# Patient Record
Sex: Female | Born: 1990 | Race: White | Hispanic: No | Marital: Single | Smoking: Current every day smoker
Health system: Southern US, Community
[De-identification: ages and names within clinical notes are randomized; demographics above are authoritative.]

## PROBLEM LIST (undated history)

## (undated) DIAGNOSIS — S3282XA Multiple fractures of pelvis without disruption of pelvic ring, initial encounter for closed fracture: Secondary | ICD-10-CM

## (undated) DIAGNOSIS — S92902A Unspecified fracture of left foot, initial encounter for closed fracture: Secondary | ICD-10-CM

## (undated) DIAGNOSIS — R569 Unspecified convulsions: Secondary | ICD-10-CM

## (undated) DIAGNOSIS — F419 Anxiety disorder, unspecified: Secondary | ICD-10-CM

## (undated) HISTORY — PX: FRACTURE SURGERY: SHX138

## (undated) HISTORY — PX: DILATION AND CURETTAGE OF UTERUS: SHX78

---

## 2017-12-21 ENCOUNTER — Emergency Department (HOSPITAL_COMMUNITY): Payer: No Typology Code available for payment source

## 2017-12-21 ENCOUNTER — Inpatient Hospital Stay (HOSPITAL_COMMUNITY)
Admission: EM | Admit: 2017-12-21 | Discharge: 2017-12-27 | DRG: 956 | Disposition: A | Discharge status: Rehabilitation Facility | Payer: No Typology Code available for payment source | Attending: Surgery | Admitting: Surgery

## 2017-12-21 ENCOUNTER — Encounter (HOSPITAL_COMMUNITY): Payer: Self-pay | Admitting: Pharmacy Technician

## 2017-12-21 DIAGNOSIS — S7292XA Unspecified fracture of left femur, initial encounter for closed fracture: Secondary | ICD-10-CM

## 2017-12-21 DIAGNOSIS — Y92410 Unspecified street and highway as the place of occurrence of the external cause: Secondary | ICD-10-CM

## 2017-12-21 DIAGNOSIS — S060X0A Concussion without loss of consciousness, initial encounter: Secondary | ICD-10-CM | POA: Diagnosis present

## 2017-12-21 DIAGNOSIS — S92242A Displaced fracture of medial cuneiform of left foot, initial encounter for closed fracture: Secondary | ICD-10-CM | POA: Diagnosis present

## 2017-12-21 DIAGNOSIS — S91311A Laceration without foreign body, right foot, initial encounter: Secondary | ICD-10-CM | POA: Diagnosis present

## 2017-12-21 DIAGNOSIS — S3993XA Unspecified injury of pelvis, initial encounter: Secondary | ICD-10-CM

## 2017-12-21 DIAGNOSIS — M25552 Pain in left hip: Secondary | ICD-10-CM | POA: Diagnosis present

## 2017-12-21 DIAGNOSIS — S92902A Unspecified fracture of left foot, initial encounter for closed fracture: Secondary | ICD-10-CM

## 2017-12-21 DIAGNOSIS — S72452A Displaced supracondylar fracture without intracondylar extension of lower end of left femur, initial encounter for closed fracture: Principal | ICD-10-CM | POA: Diagnosis present

## 2017-12-21 DIAGNOSIS — S82839A Other fracture of upper and lower end of unspecified fibula, initial encounter for closed fracture: Secondary | ICD-10-CM | POA: Diagnosis present

## 2017-12-21 DIAGNOSIS — S71122A Laceration with foreign body, left thigh, initial encounter: Secondary | ICD-10-CM | POA: Diagnosis present

## 2017-12-21 DIAGNOSIS — F419 Anxiety disorder, unspecified: Secondary | ICD-10-CM | POA: Diagnosis present

## 2017-12-21 DIAGNOSIS — S0011XA Contusion of right eyelid and periocular area, initial encounter: Secondary | ICD-10-CM | POA: Diagnosis present

## 2017-12-21 DIAGNOSIS — G40909 Epilepsy, unspecified, not intractable, without status epilepticus: Secondary | ICD-10-CM | POA: Diagnosis present

## 2017-12-21 DIAGNOSIS — R7401 Elevation of levels of liver transaminase levels: Secondary | ICD-10-CM

## 2017-12-21 DIAGNOSIS — S3210XA Unspecified fracture of sacrum, initial encounter for closed fracture: Secondary | ICD-10-CM | POA: Diagnosis present

## 2017-12-21 DIAGNOSIS — Z419 Encounter for procedure for purposes other than remedying health state, unspecified: Secondary | ICD-10-CM

## 2017-12-21 DIAGNOSIS — S32810A Multiple fractures of pelvis with stable disruption of pelvic ring, initial encounter for closed fracture: Secondary | ICD-10-CM | POA: Diagnosis present

## 2017-12-21 DIAGNOSIS — K5900 Constipation, unspecified: Secondary | ICD-10-CM | POA: Diagnosis present

## 2017-12-21 DIAGNOSIS — S72409A Unspecified fracture of lower end of unspecified femur, initial encounter for closed fracture: Secondary | ICD-10-CM

## 2017-12-21 DIAGNOSIS — S36114A Minor laceration of liver, initial encounter: Secondary | ICD-10-CM | POA: Diagnosis present

## 2017-12-21 DIAGNOSIS — T148XXA Other injury of unspecified body region, initial encounter: Secondary | ICD-10-CM

## 2017-12-21 DIAGNOSIS — S72402A Unspecified fracture of lower end of left femur, initial encounter for closed fracture: Secondary | ICD-10-CM | POA: Diagnosis present

## 2017-12-21 DIAGNOSIS — R945 Abnormal results of liver function studies: Secondary | ICD-10-CM | POA: Diagnosis present

## 2017-12-21 DIAGNOSIS — S92413A Displaced fracture of proximal phalanx of unspecified great toe, initial encounter for closed fracture: Secondary | ICD-10-CM | POA: Diagnosis present

## 2017-12-21 DIAGNOSIS — R339 Retention of urine, unspecified: Secondary | ICD-10-CM | POA: Diagnosis not present

## 2017-12-21 DIAGNOSIS — F17210 Nicotine dependence, cigarettes, uncomplicated: Secondary | ICD-10-CM | POA: Diagnosis present

## 2017-12-21 DIAGNOSIS — D62 Acute posthemorrhagic anemia: Secondary | ICD-10-CM | POA: Diagnosis not present

## 2017-12-21 DIAGNOSIS — Z23 Encounter for immunization: Secondary | ICD-10-CM | POA: Diagnosis not present

## 2017-12-21 DIAGNOSIS — S71112A Laceration without foreign body, left thigh, initial encounter: Secondary | ICD-10-CM | POA: Diagnosis present

## 2017-12-21 DIAGNOSIS — S72352A Displaced comminuted fracture of shaft of left femur, initial encounter for closed fracture: Secondary | ICD-10-CM

## 2017-12-21 DIAGNOSIS — R740 Nonspecific elevation of levels of transaminase and lactic acid dehydrogenase [LDH]: Secondary | ICD-10-CM

## 2017-12-21 DIAGNOSIS — S3282XA Multiple fractures of pelvis without disruption of pelvic ring, initial encounter for closed fracture: Secondary | ICD-10-CM

## 2017-12-21 DIAGNOSIS — R609 Edema, unspecified: Secondary | ICD-10-CM

## 2017-12-21 HISTORY — DX: Multiple fractures of pelvis without disruption of pelvic ring, initial encounter for closed fracture: S32.82XA

## 2017-12-21 HISTORY — DX: Unspecified convulsions: R56.9

## 2017-12-21 HISTORY — DX: Unspecified fracture of left foot, initial encounter for closed fracture: S92.902A

## 2017-12-21 HISTORY — DX: Anxiety disorder, unspecified: F41.9

## 2017-12-21 LAB — COMPREHENSIVE METABOLIC PANEL
ALK PHOS: 73 U/L (ref 38–126)
ALT: 325 U/L — AB (ref 14–54)
AST: 658 U/L — AB (ref 15–41)
Albumin: 3.4 g/dL — ABNORMAL LOW (ref 3.5–5.0)
Anion gap: 8 (ref 5–15)
BILIRUBIN TOTAL: 0.7 mg/dL (ref 0.3–1.2)
BUN: 11 mg/dL (ref 6–20)
CO2: 21 mmol/L — ABNORMAL LOW (ref 22–32)
CREATININE: 0.78 mg/dL (ref 0.44–1.00)
Calcium: 8 mg/dL — ABNORMAL LOW (ref 8.9–10.3)
Chloride: 107 mmol/L (ref 101–111)
Glucose, Bld: 256 mg/dL — ABNORMAL HIGH (ref 65–99)
Potassium: 4 mmol/L (ref 3.5–5.1)
Sodium: 136 mmol/L (ref 135–145)
TOTAL PROTEIN: 5.5 g/dL — AB (ref 6.5–8.1)

## 2017-12-21 LAB — CBC
HCT: 32.8 % — ABNORMAL LOW (ref 36.0–46.0)
Hemoglobin: 11.1 g/dL — ABNORMAL LOW (ref 12.0–15.0)
MCH: 31.5 pg (ref 26.0–34.0)
MCHC: 33.8 g/dL (ref 30.0–36.0)
MCV: 93.2 fL (ref 78.0–100.0)
PLATELETS: 203 10*3/uL (ref 150–400)
RBC: 3.52 MIL/uL — ABNORMAL LOW (ref 3.87–5.11)
RDW: 13.9 % (ref 11.5–15.5)
WBC: 18.6 10*3/uL — AB (ref 4.0–10.5)

## 2017-12-21 LAB — I-STAT CHEM 8, ED
BUN: 11 mg/dL (ref 6–20)
CREATININE: 0.7 mg/dL (ref 0.44–1.00)
Calcium, Ion: 1.11 mmol/L — ABNORMAL LOW (ref 1.15–1.40)
Chloride: 104 mmol/L (ref 101–111)
GLUCOSE: 252 mg/dL — AB (ref 65–99)
HEMATOCRIT: 31 % — AB (ref 36.0–46.0)
Hemoglobin: 10.5 g/dL — ABNORMAL LOW (ref 12.0–15.0)
POTASSIUM: 4 mmol/L (ref 3.5–5.1)
Sodium: 140 mmol/L (ref 135–145)
TCO2: 22 mmol/L (ref 22–32)

## 2017-12-21 LAB — SAMPLE TO BLOOD BANK

## 2017-12-21 LAB — CDS SEROLOGY

## 2017-12-21 LAB — I-STAT CG4 LACTIC ACID, ED: LACTIC ACID, VENOUS: 1.33 mmol/L (ref 0.5–1.9)

## 2017-12-21 LAB — PROTIME-INR
INR: 1.11
PROTHROMBIN TIME: 14.2 s (ref 11.4–15.2)

## 2017-12-21 LAB — ETHANOL

## 2017-12-21 LAB — I-STAT BETA HCG BLOOD, ED (MC, WL, AP ONLY)

## 2017-12-21 MED ORDER — HYDROMORPHONE HCL 1 MG/ML IJ SOLN
1.0000 mg | INTRAMUSCULAR | Status: DC | PRN
  Administered 2017-12-21: 1 mg via INTRAVENOUS
  Filled 2017-12-21 (×3): qty 1

## 2017-12-21 MED ORDER — IOPAMIDOL (ISOVUE-300) INJECTION 61%
INTRAVENOUS | Status: AC
  Administered 2017-12-21: 100 mL
  Filled 2017-12-21: qty 100

## 2017-12-21 MED ORDER — DIAZEPAM 5 MG/ML IJ SOLN
INTRAMUSCULAR | Status: AC
  Filled 2017-12-21: qty 2

## 2017-12-21 MED ORDER — CEFAZOLIN SODIUM-DEXTROSE 1-4 GM/50ML-% IV SOLN
1.0000 g | Freq: Once | INTRAVENOUS | Status: AC
  Administered 2017-12-21: 1 g via INTRAVENOUS
  Filled 2017-12-21: qty 50

## 2017-12-21 MED ORDER — HYDROMORPHONE HCL 1 MG/ML IJ SOLN
1.0000 mg | Freq: Once | INTRAMUSCULAR | Status: AC
  Administered 2017-12-21: 1 mg via INTRAVENOUS
  Filled 2017-12-21: qty 1

## 2017-12-21 MED ORDER — ONDANSETRON HCL 4 MG/2ML IJ SOLN
4.0000 mg | Freq: Once | INTRAMUSCULAR | Status: AC
  Administered 2017-12-21: 4 mg via INTRAVENOUS
  Filled 2017-12-21: qty 2

## 2017-12-21 MED ORDER — FENTANYL CITRATE (PF) 100 MCG/2ML IJ SOLN
100.0000 ug | Freq: Once | INTRAMUSCULAR | Status: AC
Start: 2017-12-21 — End: 2017-12-21
  Administered 2017-12-21: 100 ug via INTRAVENOUS
  Filled 2017-12-21: qty 2

## 2017-12-21 MED ORDER — TETANUS-DIPHTH-ACELL PERTUSSIS 5-2.5-18.5 LF-MCG/0.5 IM SUSP
0.5000 mL | Freq: Once | INTRAMUSCULAR | Status: AC
  Administered 2017-12-21: 0.5 mL via INTRAMUSCULAR
  Filled 2017-12-21: qty 0.5
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
## 2017-12-21 NOTE — ED Notes (Signed)
[facility] Md  at bedside.
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
## 2017-12-21 NOTE — Progress Notes (Signed)
Orthopedic Tech Progress Note Patient Details:  Michele DoloresDarian Robertson 12/06/1991 [id number]  Ortho Devices Type of Ortho Device: Knee Immobilizer Ortho Device/Splint Location: lle Ortho Device/Splint Interventions: Ordered, Application, Adjustment   Post Interventions Patient Tolerated: Well Instructions Provided: Care of device, Adjustment of device   Trinna PostMartinez, Karlin Heilman J 12/21/2017, 10:56 PM

## 2017-12-21 NOTE — ED Notes (Signed)
Mother Elease Hashimotoatricia [phone] Boyfriend Denzel, [phone]

## 2017-12-22 ENCOUNTER — Encounter (HOSPITAL_COMMUNITY): Payer: Self-pay | Admitting: Anesthesiology

## 2017-12-22 ENCOUNTER — Encounter (HOSPITAL_COMMUNITY): Admission: EM | Disposition: A | Discharge status: Rehabilitation Facility | Payer: Self-pay | Source: Home / Self Care

## 2017-12-22 ENCOUNTER — Inpatient Hospital Stay (HOSPITAL_COMMUNITY): Payer: No Typology Code available for payment source | Admitting: Anesthesiology

## 2017-12-22 ENCOUNTER — Inpatient Hospital Stay (HOSPITAL_COMMUNITY): Payer: No Typology Code available for payment source

## 2017-12-22 DIAGNOSIS — S71122A Laceration with foreign body, left thigh, initial encounter: Secondary | ICD-10-CM | POA: Diagnosis present

## 2017-12-22 HISTORY — PX: EXTERNAL FIXATION LEG: SHX1549

## 2017-12-22 LAB — COMPREHENSIVE METABOLIC PANEL
ALK PHOS: 63 U/L (ref 38–126)
ALT: 279 U/L — AB (ref 14–54)
AST: 414 U/L — AB (ref 15–41)
Albumin: 3.4 g/dL — ABNORMAL LOW (ref 3.5–5.0)
Anion gap: 8 (ref 5–15)
BILIRUBIN TOTAL: 0.7 mg/dL (ref 0.3–1.2)
BUN: 10 mg/dL (ref 6–20)
CALCIUM: 8.1 mg/dL — AB (ref 8.9–10.3)
CO2: 23 mmol/L (ref 22–32)
Chloride: 107 mmol/L (ref 101–111)
Creatinine, Ser: 0.67 mg/dL (ref 0.44–1.00)
GFR calc Af Amer: >60 mL/min (ref >60)
GFR calc non Af Amer: >60 mL/min (ref >60)
GLUCOSE: 159 mg/dL — AB (ref 65–99)
Potassium: 4.1 mmol/L (ref 3.5–5.1)
SODIUM: 138 mmol/L (ref 135–145)
TOTAL PROTEIN: 5.4 g/dL — AB (ref 6.5–8.1)

## 2017-12-22 LAB — CBC
HCT: 26.9 % — ABNORMAL LOW (ref 36.0–46.0)
HCT: 25 % — ABNORMAL LOW (ref 36.0–46.0)
HEMATOCRIT: 29.4 % — AB (ref 36.0–46.0)
HEMOGLOBIN: 10 g/dL — AB (ref 12.0–15.0)
Hemoglobin: 9.2 g/dL — ABNORMAL LOW (ref 12.0–15.0)
Hemoglobin: 8.5 g/dL — ABNORMAL LOW (ref 12.0–15.0)
MCH: 31.9 pg (ref 26.0–34.0)
MCH: 31.8 pg (ref 26.0–34.0)
MCH: 32.1 pg (ref 26.0–34.0)
MCHC: 34 g/dL (ref 30.0–36.0)
MCHC: 34.2 g/dL (ref 30.0–36.0)
MCHC: 34 g/dL (ref 30.0–36.0)
MCV: 93.9 fL (ref 78.0–100.0)
MCV: 93.1 fL (ref 78.0–100.0)
MCV: 94.3 fL (ref 78.0–100.0)
PLATELETS: 148 10*3/uL — AB (ref 150–400)
PLATELETS: 121 10*3/uL — AB (ref 150–400)
Platelets: 156 10*3/uL (ref 150–400)
RBC: 3.13 MIL/uL — AB (ref 3.87–5.11)
RBC: 2.89 MIL/uL — ABNORMAL LOW (ref 3.87–5.11)
RBC: 2.65 MIL/uL — ABNORMAL LOW (ref 3.87–5.11)
RDW: 14.4 % (ref 11.5–15.5)
RDW: 14 % (ref 11.5–15.5)
RDW: 14.4 % (ref 11.5–15.5)
WBC: 12.8 10*3/uL — AB (ref 4.0–10.5)
WBC: 12.3 10*3/uL — ABNORMAL HIGH (ref 4.0–10.5)
WBC: 10.7 10*3/uL — ABNORMAL HIGH (ref 4.0–10.5)

## 2017-12-22 LAB — URINALYSIS, ROUTINE W REFLEX MICROSCOPIC
Bacteria, UA: NONE SEEN
Bilirubin Urine: NEGATIVE
GLUCOSE, UA: 50 mg/dL — AB
Hgb urine dipstick: NEGATIVE
KETONES UR: NEGATIVE mg/dL
Nitrite: NEGATIVE
PH: 5 (ref 5.0–8.0)
PROTEIN: NEGATIVE mg/dL
Specific Gravity, Urine: >1.046 — ABNORMAL HIGH (ref 1.005–1.030)

## 2017-12-22 LAB — HIV ANTIBODY (ROUTINE TESTING W REFLEX): HIV SCREEN 4TH GENERATION: NONREACTIVE

## 2017-12-22 LAB — MRSA PCR SCREENING: MRSA by PCR: POSITIVE — AB

## 2017-12-22 SURGERY — EXTERNAL FIXATION, LOWER EXTREMITY
Anesthesia: General | Site: Leg Upper | Laterality: Left

## 2017-12-22 MED ORDER — OXYCODONE HCL 5 MG/5ML PO SOLN: 5.0000 mg | Freq: Once | ORAL | Status: DC | PRN

## 2017-12-22 MED ORDER — ARTIFICIAL TEARS OPHTHALMIC OINT
TOPICAL_OINTMENT | OPHTHALMIC | Status: AC
  Filled 2017-12-22: qty 3.5

## 2017-12-22 MED ORDER — HYDROMORPHONE HCL 1 MG/ML IJ SOLN
0.2500 mg | INTRAMUSCULAR | Status: DC | PRN
  Administered 2017-12-22 (×2): 0.5 mg via INTRAVENOUS

## 2017-12-22 MED ORDER — ONDANSETRON HCL 4 MG/2ML IJ SOLN
INTRAMUSCULAR | Status: DC | PRN
  Administered 2017-12-22: 4 mg via INTRAVENOUS

## 2017-12-22 MED ORDER — PROPOFOL 10 MG/ML IV BOLUS
INTRAVENOUS | Status: AC
  Filled 2017-12-22: qty 40

## 2017-12-22 MED ORDER — ACETAMINOPHEN 325 MG PO TABS
650.0000 mg | ORAL_TABLET | Freq: Four times a day (QID) | ORAL | Status: DC | PRN
  Administered 2017-12-22 – 2017-12-23 (×3): 650 mg via ORAL
  Filled 2017-12-22 (×3): qty 2

## 2017-12-22 MED ORDER — CEFAZOLIN SODIUM-DEXTROSE 2-3 GM-%(50ML) IV SOLR
INTRAVENOUS | Status: DC | PRN
  Administered 2017-12-22: 2 g via INTRAVENOUS

## 2017-12-22 MED ORDER — HYDROMORPHONE HCL 1 MG/ML IJ SOLN
INTRAMUSCULAR | Status: AC
  Administered 2017-12-22: 0.5 mg via INTRAVENOUS
  Filled 2017-12-22: qty 1

## 2017-12-22 MED ORDER — MIDAZOLAM HCL 2 MG/2ML IJ SOLN
INTRAMUSCULAR | Status: AC
  Filled 2017-12-22: qty 2

## 2017-12-22 MED ORDER — PROPOFOL 10 MG/ML IV BOLUS
INTRAVENOUS | Status: DC | PRN
  Administered 2017-12-22: 110 mg via INTRAVENOUS

## 2017-12-22 MED ORDER — HYDROMORPHONE HCL 1 MG/ML IJ SOLN: 0.5000 mg | INTRAMUSCULAR | Status: DC | PRN

## 2017-12-22 MED ORDER — LACTATED RINGERS IV SOLN
INTRAVENOUS | Status: DC | PRN
  Administered 2017-12-22: via INTRAVENOUS

## 2017-12-22 MED ORDER — LIDOCAINE HCL (CARDIAC) 20 MG/ML IV SOLN
INTRAVENOUS | Status: DC | PRN
  Administered 2017-12-22: 60 mg via INTRAVENOUS

## 2017-12-22 MED ORDER — HYDROMORPHONE HCL 1 MG/ML IJ SOLN
1.0000 mg | INTRAMUSCULAR | Status: DC | PRN
  Administered 2017-12-22 – 2017-12-23 (×10): 1 mg via INTRAVENOUS
  Filled 2017-12-22 (×10): qty 1

## 2017-12-22 MED ORDER — OXYCODONE HCL 5 MG PO TABS
5.0000 mg | ORAL_TABLET | ORAL | Status: DC | PRN
  Administered 2017-12-22 – 2017-12-23 (×4): 5 mg via ORAL
  Filled 2017-12-22 (×4): qty 1

## 2017-12-22 MED ORDER — SODIUM CHLORIDE 0.9 % IV SOLN
INTRAVENOUS | Status: DC
  Administered 2017-12-22: 04:00:00 via INTRAVENOUS

## 2017-12-22 MED ORDER — DEXAMETHASONE SODIUM PHOSPHATE 10 MG/ML IJ SOLN
INTRAMUSCULAR | Status: DC | PRN
  Administered 2017-12-22: 4 mg via INTRAVENOUS

## 2017-12-22 MED ORDER — FENTANYL CITRATE (PF) 100 MCG/2ML IJ SOLN: 25.0000 ug | INTRAMUSCULAR | Status: DC | PRN

## 2017-12-22 MED ORDER — SODIUM CHLORIDE 0.9 % IR SOLN
Status: DC | PRN
  Administered 2017-12-22: 1

## 2017-12-22 MED ORDER — FENTANYL CITRATE (PF) 250 MCG/5ML IJ SOLN
INTRAMUSCULAR | Status: AC
  Filled 2017-12-22: qty 5

## 2017-12-22 MED ORDER — LIDOCAINE 2% (20 MG/ML) 5 ML SYRINGE
INTRAMUSCULAR | Status: AC
  Filled 2017-12-22: qty 5

## 2017-12-22 MED ORDER — SUCCINYLCHOLINE CHLORIDE 200 MG/10ML IV SOSY
PREFILLED_SYRINGE | INTRAVENOUS | Status: AC
  Filled 2017-12-22: qty 10

## 2017-12-22 MED ORDER — OXYCODONE HCL 5 MG PO TABS: 5.0000 mg | ORAL_TABLET | Freq: Once | ORAL | Status: DC | PRN

## 2017-12-22 MED ORDER — CEFAZOLIN SODIUM-DEXTROSE 2-4 GM/100ML-% IV SOLN
INTRAVENOUS | Status: AC
  Filled 2017-12-22: qty 100

## 2017-12-22 MED ORDER — FENTANYL CITRATE (PF) 100 MCG/2ML IJ SOLN
INTRAMUSCULAR | Status: DC | PRN
  Administered 2017-12-22: 100 ug via INTRAVENOUS

## 2017-12-22 MED ORDER — CEFAZOLIN SODIUM-DEXTROSE 2-4 GM/100ML-% IV SOLN
2.0000 g | Freq: Four times a day (QID) | INTRAVENOUS | Status: AC
  Administered 2017-12-22 (×2): 2 g via INTRAVENOUS
  Filled 2017-12-22 (×3): qty 100

## 2017-12-22 MED ORDER — SUCCINYLCHOLINE CHLORIDE 20 MG/ML IJ SOLN
INTRAMUSCULAR | Status: DC | PRN
  Administered 2017-12-22: 50 mg via INTRAVENOUS

## 2017-12-22 MED ORDER — ONDANSETRON HCL 4 MG/2ML IJ SOLN
4.0000 mg | Freq: Four times a day (QID) | INTRAMUSCULAR | Status: DC | PRN
  Administered 2017-12-22: 4 mg via INTRAVENOUS
  Filled 2017-12-22: qty 2

## 2017-12-22 MED ORDER — KCL IN DEXTROSE-NACL 20-5-0.45 MEQ/L-%-% IV SOLN
INTRAVENOUS | Status: DC
  Administered 2017-12-22: 13:00:00 via INTRAVENOUS
  Administered 2017-12-22: 50 mL/h via INTRAVENOUS
  Administered 2017-12-23: 23:00:00 via INTRAVENOUS
  Filled 2017-12-22 (×2): qty 1000

## 2017-12-22 MED ORDER — ONDANSETRON HCL 4 MG/2ML IJ SOLN
INTRAMUSCULAR | Status: AC
  Filled 2017-12-22: qty 2

## 2017-12-22 MED ORDER — ONDANSETRON 4 MG PO TBDP: 4.0000 mg | ORAL_TABLET | Freq: Four times a day (QID) | ORAL | Status: DC | PRN

## 2017-12-22 MED ORDER — DEXAMETHASONE SODIUM PHOSPHATE 10 MG/ML IJ SOLN
INTRAMUSCULAR | Status: AC
  Filled 2017-12-22: qty 1

## 2017-12-22 MED ORDER — ENOXAPARIN SODIUM 40 MG/0.4ML ~~LOC~~ SOLN
40.0000 mg | SUBCUTANEOUS | Status: DC
  Administered 2017-12-22 – 2017-12-26 (×5): 40 mg via SUBCUTANEOUS
  Filled 2017-12-22 (×5): qty 0.4

## 2017-12-22 SURGICAL SUPPLY — 52 items
BANDAGE ACE 4X5 VEL STRL LF (GAUZE/BANDAGES/DRESSINGS) ×3 IMPLANT
BANDAGE ACE 6X5 VEL STRL LF (GAUZE/BANDAGES/DRESSINGS) ×3 IMPLANT
BANDAGE ELASTIC 4 VELCRO ST LF (GAUZE/BANDAGES/DRESSINGS) ×3 IMPLANT
BANDAGE ELASTIC 6 VELCRO ST LF (GAUZE/BANDAGES/DRESSINGS) ×3 IMPLANT
BNDG COHESIVE 6X5 TAN STRL LF (GAUZE/BANDAGES/DRESSINGS) ×3 IMPLANT
BNDG GAUZE ELAST 4 BULKY (GAUZE/BANDAGES/DRESSINGS) ×3 IMPLANT
CANISTER WOUNDNEG PRESSURE 500 (CANNISTER) ×3 IMPLANT
CLAMP LG COMBINATION (Clamp) ×3 IMPLANT
CLAMP LG MULTI PIN (Clamp) ×6 IMPLANT
COVER SURGICAL LIGHT HANDLE (MISCELLANEOUS) ×3 IMPLANT
DRAPE C-ARM 42X72 X-RAY (DRAPES) IMPLANT
DRAPE C-ARMOR (DRAPES) ×3 IMPLANT
DRAPE ORTHO SPLIT 87X125 STRL (DRAPES) ×6 IMPLANT
DRAPE U-SHAPE 47X51 STRL (DRAPES) ×3 IMPLANT
DRSG ADAPTIC 3X8 NADH LF (GAUZE/BANDAGES/DRESSINGS) ×3 IMPLANT
DRSG VAC ATS SM SENSATRAC (GAUZE/BANDAGES/DRESSINGS) ×3 IMPLANT
ELECT REM PT RETURN 9FT ADLT (ELECTROSURGICAL) ×3
ELECTRODE REM PT RTRN 9FT ADLT (ELECTROSURGICAL) ×1 IMPLANT
GAUZE SPONGE 4X4 12PLY STRL (GAUZE/BANDAGES/DRESSINGS) ×3 IMPLANT
GAUZE XEROFORM 5X9 LF (GAUZE/BANDAGES/DRESSINGS) ×3 IMPLANT
GLOVE BIO SURGEON STRL SZ7.5 (GLOVE) ×3 IMPLANT
GLOVE BIOGEL PI IND STRL 8 (GLOVE) ×2 IMPLANT
GLOVE BIOGEL PI INDICATOR 8 (GLOVE) ×4
GOWN STRL REUS W/ TWL LRG LVL3 (GOWN DISPOSABLE) ×2 IMPLANT
GOWN STRL REUS W/ TWL XL LVL3 (GOWN DISPOSABLE) ×1 IMPLANT
GOWN STRL REUS W/TWL LRG LVL3 (GOWN DISPOSABLE) ×4
GOWN STRL REUS W/TWL XL LVL3 (GOWN DISPOSABLE) ×2
KIT BASIN OR (CUSTOM PROCEDURE TRAY) ×3 IMPLANT
KIT ROOM TURNOVER OR (KITS) ×3 IMPLANT
NEEDLE 22X1 1/2 (OR ONLY) (NEEDLE) IMPLANT
NS IRRIG 1000ML POUR BTL (IV SOLUTION) ×3 IMPLANT
PACK ORTHO EXTREMITY (CUSTOM PROCEDURE TRAY) ×3 IMPLANT
PAD ABD 8X10 STRL (GAUZE/BANDAGES/DRESSINGS) ×3 IMPLANT
PAD ARMBOARD 7.5X6 YLW CONV (MISCELLANEOUS) ×6 IMPLANT
PADDING CAST COTTON 6X4 STRL (CAST SUPPLIES) ×9 IMPLANT
ROD CRBN FBR LRG EX-FX 11X250 (Rod) ×3 IMPLANT
ROD CRBN FBR LRG EX-FX 11X300 (Rod) ×3 IMPLANT
SCREW SCHNZ SD 5.0 60 THRD/150 (Screw) ×2 IMPLANT
SCREW SHANZ 5.0X175MM (Screw) ×6 IMPLANT
SCRW SCHANZ SD 5.0 60 THRD/150 (Screw) ×6 IMPLANT
SPONGE LAP 18X18 X RAY DECT (DISPOSABLE) ×3 IMPLANT
STAPLER VISISTAT 35W (STAPLE) IMPLANT
STOCKINETTE IMPERVIOUS LG (DRAPES) ×3 IMPLANT
SUT ETHILON 2 0 FS 18 (SUTURE) ×3 IMPLANT
SYR CONTROL 10ML LL (SYRINGE) IMPLANT
TOWEL OR 17X24 6PK STRL BLUE (TOWEL DISPOSABLE) ×6 IMPLANT
TOWEL OR 17X26 10 PK STRL BLUE (TOWEL DISPOSABLE) ×6 IMPLANT
TUBE CONNECTING 12'X1/4 (SUCTIONS) ×1
TUBE CONNECTING 12X1/4 (SUCTIONS) ×2 IMPLANT
UNDERPAD 30X30 (UNDERPADS AND DIAPERS) ×3 IMPLANT
WATER STERILE IRR 1000ML POUR (IV SOLUTION) ×6 IMPLANT
YANKAUER SUCT BULB TIP NO VENT (SUCTIONS) ×3 IMPLANT

## 2017-12-22 NOTE — Anesthesia Preprocedure Evaluation (Addendum)
Anesthesia Evaluation  Patient identified by MRN, date of birth, ID bandGeneral Assessment Comment:Drowsy from pain meds. Answers questions. Family at bedside to confirm   Reviewed: Allergy & Precautions, NPO status , Patient's Chart, lab work & pertinent test results  History of Anesthesia Complications Negative for: history of anesthetic complications  Airway Mallampati: I  TM Distance: >3 FB Neck ROM: Full    Dental  (+) Teeth Intact, Dental Advisory Given,    Pulmonary neg pulmonary ROS,    Pulmonary exam normal        Cardiovascular negative cardio ROS   Rhythm:Regular Rate:Tachycardia     Neuro/Psych negative neurological ROS  negative psych ROS   GI/Hepatic negative GI ROS, Grade 1 liver lac   Endo/Other  Elevated BG without h/o diabetes, likely stress response, will follow  Renal/GU negative Renal ROS     Musculoskeletal Pelvic fx, open left distal femur fx   Abdominal   Peds  Hematology  (+) anemia ,   Anesthesia Other Findings Right upper tooth with cap/repair per family  Reproductive/Obstetrics                            Anesthesia Physical Anesthesia Plan  ASA: I and emergent  Anesthesia Plan: General   Post-op Pain Management:    Induction: Intravenous, Rapid sequence and Cricoid pressure planned  PONV Risk Score and Plan: 3 and Ondansetron, Dexamethasone and Treatment may vary due to age or medical condition  Airway Management Planned: Oral ETT  Additional Equipment: None  Intra-op Plan:   Post-operative Plan: Extubation in OR  Informed Consent: I have reviewed the patients History and Physical, chart, labs and discussed the procedure including the risks, benefits and alternatives for the proposed anesthesia with the patient or authorized representative who has indicated his/her understanding and acceptance.   Dental advisory given  Plan Discussed with: Surgeon  and CRNA  Anesthesia Plan Comments:         Anesthesia Quick Evaluation

## 2017-12-22 NOTE — Brief Op Note (Signed)
12/22/2017  2:00 AM  PATIENT:  [name]  27 y.o. female  PRE-OPERATIVE DIAGNOSIS:  LEFT DISTAL FEMUR FX  POST-OPERATIVE DIAGNOSIS:  * No post-op diagnosis entered *  PROCEDURE:  Procedure(s): EXTERNAL FIXATION LEG, LEFT KNEE, WOUND DEBRIDEMENT LEFT THIGH WITH VAC APPLICATION (Left)  SURGEON:  Surgeon(s) and Role:    * Yolonda Kidaogers, Vyctoria Dickman Patrick, MD - Primary  PHYSICIAN ASSISTANT:   ASSISTANTS: none   ANESTHESIA:   general  EBL:  25 mL   BLOOD ADMINISTERED:none  DRAINS: single wound VAC, to 125 mm Hg, continuous   LOCAL MEDICATIONS USED:  NONE  SPECIMEN:  No Specimen  DISPOSITION OF SPECIMEN:  N/A  COUNTS:  YES  TOURNIQUET:  * No tourniquets in log *  DICTATION: .Note written in EPIC  PLAN OF CARE: Admit to inpatient   PATIENT DISPOSITION:  PACU - hemodynamically stable.   Delay start of Pharmacological VTE agent (>24hrs) due to surgical blood loss or risk of bleeding: not applicable

## 2017-12-22 NOTE — Anesthesia Postprocedure Evaluation (Signed)
Anesthesia Post Note  Patient: Konrad DoloresDarian Colt  Procedure(s) Performed: EXTERNAL FIXATION LEG, LEFT KNEE, WOUND DEBRIDEMENT LEFT THIGH WITH VAC APPLICATION (Left Leg Upper)     Patient location during evaluation: PACU Anesthesia Type: General Level of consciousness: awake and alert Pain management: pain level controlled Vital Signs Assessment: post-procedure vital signs reviewed and stable Respiratory status: spontaneous breathing, nonlabored ventilation, respiratory function stable and patient connected to nasal cannula oxygen Cardiovascular status: blood pressure returned to baseline and stable Postop Assessment: no apparent nausea or vomiting Anesthetic complications: no    Last Vitals:  Vitals:   12/22/17 0311 12/22/17 0323  BP: (!) 131/95 131/87  Pulse: 90 90  Resp:  13  Temp:  36.8 C  SpO2: 100% 100%    Last Pain:  Vitals:   12/22/17 0323  TempSrc: Oral  PainSc:                  Doil Kamara

## 2017-12-22 NOTE — Progress Notes (Signed)
Subjective: Day of Surgery Procedure(s) (LRB): EXTERNAL FIXATION LEG, LEFT KNEE, WOUND DEBRIDEMENT LEFT THIGH WITH VAC APPLICATION (Left) Patient reports pain as moderate to left leg. Resting in bed.  Family at bedside. Denies SOB or CP.  Objective: Vital signs in last 24 hours: Temp:  [97.7 F (36.5 C)-98.3 F (36.8 C)] 98.3 F (36.8 C) (01/13 0323) Pulse Rate:  [88-117] 90 (01/13 0323) Resp:  [13-22] 13 (01/13 0323) BP: (122-144)/(70-109) 131/87 (01/13 0323) SpO2:  [92 %-100 %] 100 % (01/13 0323)  Intake/Output from previous day: 01/12 0701 - 01/13 0700 In: 725 [I.V.:675; IV Piggyback:50] Out: 25 [Blood:25] Intake/Output this shift: No intake/output data recorded.  Recent Labs    12/21/17 2141 12/21/17 2202 12/22/17 0547  HGB 11.1* 10.5* 10.0*   Recent Labs    12/21/17 2141 12/21/17 2202 12/22/17 0547  WBC 18.6*  --  12.8*  RBC 3.52*  --  3.13*  HCT 32.8* 31.0* 29.4*  PLT 203  --  156   Recent Labs    12/21/17 2141 12/21/17 2202 12/22/17 0547  NA 136 140 138  K 4.0 4.0 4.1  CL 107 104 107  CO2 21*  --  23  BUN 11 11 10   CREATININE 0.78 0.70 0.67  GLUCOSE 256* 252* 159*  CALCIUM 8.0*  --  8.1*   Recent Labs    12/21/17 2141  INR 1.11    Alert and oriented x3. RRR, Lungs clear, BS x4. Left Calf soft and non tender. L knee dressing C/D/I. EX-FIX pin sites clean. No DVT signs. No signs of infection or compartment syndrome. LLE grossly neurovascularly intact. 2+ pedal pulse. Plantar and dorsi flexion in tact. Sensation intact to touch.    Assessment/Plan: Day of Surgery Procedure(s) (LRB): EXTERNAL FIXATION LEG, LEFT KNEE, WOUND DEBRIDEMENT LEFT THIGH WITH VAC APPLICATION (Left) NWB Ex-FIX care Dr.Rogers to discuss with Traumatologist possible surgery tomorrow Pain management Family updated Discussed with Dr.Rogers NPO MN Bladder scan   Andrea Ferrer L 12/22/2017, 9:00 AM
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
## 2017-12-22 NOTE — Transfer of Care (Signed)
Immediate Anesthesia Transfer of Care Note  Patient: [name]  Procedure(s) Performed: EXTERNAL FIXATION LEG, LEFT KNEE, WOUND DEBRIDEMENT LEFT THIGH WITH VAC APPLICATION (Left Leg Upper)  Patient Location: PACU  Anesthesia Type:General  Level of Consciousness: awake and alert   Airway & Oxygen Therapy: Patient Spontanous Breathing and Patient connected to nasal cannula oxygen  Post-op Assessment: Report given to RN and Post -op Vital signs reviewed and stable  Post vital signs: Reviewed and stable  Last Vitals:  Vitals:   12/22/17 0015 12/22/17 0210  BP: (!) 138/92   Pulse: (!) 117   Resp: 15   Temp:  (P) 36.5 C  SpO2: 92%     Last Pain:  Vitals:   12/22/17 0024  TempSrc:   PainSc: 8          Complications: No apparent anesthesia complications. Patient complaining of pain in left upper leg.
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
## 2017-12-22 NOTE — Anesthesia Procedure Notes (Signed)
Procedure Name: Intubation Date/Time: 12/22/2017 12:48 AM Performed by: Edmonia CaprioAuston, Aleksandar Duve M, CRNA Pre-anesthesia Checklist: Patient identified, Emergency Drugs available, Suction available, Patient being monitored and Timeout performed Patient Re-evaluated:Patient Re-evaluated prior to induction Oxygen Delivery Method: Circle system utilized Preoxygenation: Pre-oxygenation with 100% oxygen Induction Type: IV induction, Rapid sequence and Cricoid Pressure applied Laryngoscope Size: Miller and 2 Grade View: Grade I Tube type: Oral Tube size: 7.0 mm Number of attempts: 1 Airway Equipment and Method: Stylet Secured at: 22 cm Tube secured with: Tape Dental Injury: Teeth and Oropharynx as per pre-operative assessment

## 2017-12-23 ENCOUNTER — Inpatient Hospital Stay (HOSPITAL_COMMUNITY): Payer: No Typology Code available for payment source | Admitting: Certified Registered"

## 2017-12-23 ENCOUNTER — Inpatient Hospital Stay (HOSPITAL_COMMUNITY): Payer: No Typology Code available for payment source

## 2017-12-23 ENCOUNTER — Encounter (HOSPITAL_COMMUNITY): Payer: Self-pay

## 2017-12-23 ENCOUNTER — Encounter (HOSPITAL_COMMUNITY): Admission: EM | Disposition: A | Discharge status: Rehabilitation Facility | Payer: Self-pay | Source: Home / Self Care

## 2017-12-23 HISTORY — PX: I & D EXTREMITY: SHX5045

## 2017-12-23 LAB — COMPREHENSIVE METABOLIC PANEL
ALT: 126 U/L — ABNORMAL HIGH (ref 14–54)
ANION GAP: 8 (ref 5–15)
AST: 118 U/L — ABNORMAL HIGH (ref 15–41)
Albumin: 2.9 g/dL — ABNORMAL LOW (ref 3.5–5.0)
Alkaline Phosphatase: 49 U/L (ref 38–126)
BUN: <5 mg/dL — ABNORMAL LOW (ref 6–20)
CALCIUM: 8 mg/dL — AB (ref 8.9–10.3)
CHLORIDE: 105 mmol/L (ref 101–111)
CO2: 24 mmol/L (ref 22–32)
Creatinine, Ser: 0.52 mg/dL (ref 0.44–1.00)
GFR calc non Af Amer: >60 mL/min (ref >60)
Glucose, Bld: 101 mg/dL — ABNORMAL HIGH (ref 65–99)
POTASSIUM: 3.8 mmol/L (ref 3.5–5.1)
SODIUM: 137 mmol/L (ref 135–145)
Total Bilirubin: 0.9 mg/dL (ref 0.3–1.2)
Total Protein: 5.1 g/dL — ABNORMAL LOW (ref 6.5–8.1)

## 2017-12-23 LAB — PREPARE RBC (CROSSMATCH)

## 2017-12-23 LAB — POCT I-STAT 4, (NA,K, GLUC, HGB,HCT)
GLUCOSE: 131 mg/dL — AB (ref 65–99)
Glucose, Bld: 114 mg/dL — ABNORMAL HIGH (ref 65–99)
Glucose, Bld: 147 mg/dL — ABNORMAL HIGH (ref 65–99)
HCT: 20 % — ABNORMAL LOW (ref 36.0–46.0)
HCT: 23 % — ABNORMAL LOW (ref 36.0–46.0)
HCT: 27 % — ABNORMAL LOW (ref 36.0–46.0)
HEMOGLOBIN: 6.8 g/dL — AB (ref 12.0–15.0)
Hemoglobin: 7.8 g/dL — ABNORMAL LOW (ref 12.0–15.0)
Hemoglobin: 9.2 g/dL — ABNORMAL LOW (ref 12.0–15.0)
POTASSIUM: 4 mmol/L (ref 3.5–5.1)
POTASSIUM: 4.7 mmol/L (ref 3.5–5.1)
Potassium: 4.1 mmol/L (ref 3.5–5.1)
SODIUM: 137 mmol/L (ref 135–145)
Sodium: 138 mmol/L (ref 135–145)
Sodium: 140 mmol/L (ref 135–145)

## 2017-12-23 LAB — ABO/RH: ABO/RH(D): O POS

## 2017-12-23 SURGERY — CLOSED REDUCTION, PELVIS, WITH PERCUTANEOUS FIXATION
Anesthesia: General | Laterality: Left

## 2017-12-23 MED ORDER — LACTATED RINGERS IV SOLN
INTRAVENOUS | Status: DC
  Administered 2017-12-23 (×2): via INTRAVENOUS

## 2017-12-23 MED ORDER — PANTOPRAZOLE SODIUM 20 MG PO TBEC
20.0000 mg | DELAYED_RELEASE_TABLET | Freq: Every day | ORAL | Status: DC
  Administered 2017-12-24 – 2017-12-27 (×4): 20 mg via ORAL
  Filled 2017-12-23 (×5): qty 1

## 2017-12-23 MED ORDER — CEFAZOLIN SODIUM-DEXTROSE 2-4 GM/100ML-% IV SOLN
INTRAVENOUS | Status: AC
  Filled 2017-12-23: qty 100

## 2017-12-23 MED ORDER — ROCURONIUM BROMIDE 10 MG/ML (PF) SYRINGE
PREFILLED_SYRINGE | INTRAVENOUS | Status: AC
  Filled 2017-12-23: qty 5

## 2017-12-23 MED ORDER — HYDROMORPHONE HCL 1 MG/ML IJ SOLN
INTRAMUSCULAR | Status: AC
  Administered 2017-12-23: 0.5 mg via INTRAVENOUS
  Filled 2017-12-23: qty 1

## 2017-12-23 MED ORDER — MIDAZOLAM HCL 2 MG/2ML IJ SOLN: 1.0000 mg | Freq: Once | INTRAMUSCULAR | Status: DC

## 2017-12-23 MED ORDER — FENTANYL CITRATE (PF) 250 MCG/5ML IJ SOLN
INTRAMUSCULAR | Status: AC
  Filled 2017-12-23: qty 5

## 2017-12-23 MED ORDER — OXYCODONE HCL 5 MG PO TABS: 5.0000 mg | ORAL_TABLET | Freq: Once | ORAL | Status: DC | PRN

## 2017-12-23 MED ORDER — PROMETHAZINE HCL 25 MG/ML IJ SOLN: 6.2500 mg | INTRAMUSCULAR | Status: DC | PRN

## 2017-12-23 MED ORDER — CEFAZOLIN SODIUM-DEXTROSE 2-4 GM/100ML-% IV SOLN
2.0000 g | Freq: Once | INTRAVENOUS | Status: AC
  Administered 2017-12-23 (×2): 2 g via INTRAVENOUS

## 2017-12-23 MED ORDER — HYDROMORPHONE HCL 1 MG/ML IJ SOLN
0.5000 mg | Freq: Once | INTRAMUSCULAR | Status: AC
  Administered 2017-12-23: 0.5 mg via INTRAVENOUS

## 2017-12-23 MED ORDER — ROCURONIUM BROMIDE 10 MG/ML (PF) SYRINGE
PREFILLED_SYRINGE | INTRAVENOUS | Status: DC | PRN
  Administered 2017-12-23: 10 mg via INTRAVENOUS
  Administered 2017-12-23 (×3): 20 mg via INTRAVENOUS
  Administered 2017-12-23 (×3): 10 mg via INTRAVENOUS
  Administered 2017-12-23: 50 mg via INTRAVENOUS
  Administered 2017-12-23: 20 mg via INTRAVENOUS
  Administered 2017-12-23: 10 mg via INTRAVENOUS
  Administered 2017-12-23 (×3): 20 mg via INTRAVENOUS

## 2017-12-23 MED ORDER — LIDOCAINE 2% (20 MG/ML) 5 ML SYRINGE
INTRAMUSCULAR | Status: AC
  Filled 2017-12-23: qty 5

## 2017-12-23 MED ORDER — HYDROMORPHONE HCL 1 MG/ML IJ SOLN
0.5000 mg | INTRAMUSCULAR | Status: DC | PRN
  Administered 2017-12-23 – 2017-12-24 (×6): 1 mg via INTRAVENOUS
  Filled 2017-12-23 (×6): qty 1

## 2017-12-23 MED ORDER — MIDAZOLAM HCL 2 MG/2ML IJ SOLN
INTRAMUSCULAR | Status: AC
  Filled 2017-12-23: qty 2

## 2017-12-23 MED ORDER — ONDANSETRON HCL 4 MG/2ML IJ SOLN
INTRAMUSCULAR | Status: AC
  Filled 2017-12-23: qty 2

## 2017-12-23 MED ORDER — DEXAMETHASONE SODIUM PHOSPHATE 10 MG/ML IJ SOLN
INTRAMUSCULAR | Status: DC | PRN
  Administered 2017-12-23: 10 mg via INTRAVENOUS

## 2017-12-23 MED ORDER — ONDANSETRON HCL 4 MG/2ML IJ SOLN
INTRAMUSCULAR | Status: DC | PRN
  Administered 2017-12-23: 4 mg via INTRAVENOUS

## 2017-12-23 MED ORDER — BETHANECHOL CHLORIDE 10 MG PO TABS
10.0000 mg | ORAL_TABLET | Freq: Three times a day (TID) | ORAL | Status: DC
  Administered 2017-12-23 – 2017-12-25 (×7): 10 mg via ORAL
  Filled 2017-12-23 (×9): qty 1

## 2017-12-23 MED ORDER — MIDAZOLAM HCL 5 MG/5ML IJ SOLN
INTRAMUSCULAR | Status: DC | PRN
  Administered 2017-12-23: 2 mg via INTRAVENOUS

## 2017-12-23 MED ORDER — FENTANYL CITRATE (PF) 100 MCG/2ML IJ SOLN: 25.0000 ug | INTRAMUSCULAR | Status: DC | PRN

## 2017-12-23 MED ORDER — HYDROMORPHONE HCL 1 MG/ML IJ SOLN
INTRAMUSCULAR | Status: AC
  Filled 2017-12-23: qty 0.5

## 2017-12-23 MED ORDER — OXYCODONE HCL 5 MG PO TABS
5.0000 mg | ORAL_TABLET | ORAL | Status: DC | PRN
  Administered 2017-12-23 – 2017-12-27 (×15): 10 mg via ORAL
  Filled 2017-12-23 (×15): qty 2

## 2017-12-23 MED ORDER — ONDANSETRON HCL 4 MG/2ML IJ SOLN: 4.0000 mg | Freq: Once | INTRAMUSCULAR | Status: DC | PRN

## 2017-12-23 MED ORDER — CEFAZOLIN SODIUM-DEXTROSE 1-4 GM/50ML-% IV SOLN
1.0000 g | Freq: Three times a day (TID) | INTRAVENOUS | Status: AC
  Administered 2017-12-23 – 2017-12-24 (×3): 1 g via INTRAVENOUS
  Filled 2017-12-23 (×3): qty 50

## 2017-12-23 MED ORDER — OXYCODONE HCL 5 MG/5ML PO SOLN: 5.0000 mg | Freq: Once | ORAL | Status: DC | PRN

## 2017-12-23 MED ORDER — CEFAZOLIN SODIUM 1 G IJ SOLR
INTRAMUSCULAR | Status: AC
  Filled 2017-12-23: qty 20

## 2017-12-23 MED ORDER — SODIUM CHLORIDE 0.9 % IV SOLN
INTRAVENOUS | Status: DC | PRN
  Administered 2017-12-23: 17:00:00 via INTRAVENOUS

## 2017-12-23 MED ORDER — FENTANYL CITRATE (PF) 100 MCG/2ML IJ SOLN
INTRAMUSCULAR | Status: DC | PRN
  Administered 2017-12-23: 50 ug via INTRAVENOUS
  Administered 2017-12-23: 150 ug via INTRAVENOUS
  Administered 2017-12-23: 50 ug via INTRAVENOUS
  Administered 2017-12-23: 100 ug via INTRAVENOUS
  Administered 2017-12-23 (×3): 50 ug via INTRAVENOUS

## 2017-12-23 MED ORDER — MUPIROCIN 2 % EX OINT
TOPICAL_OINTMENT | Freq: Two times a day (BID) | CUTANEOUS | Status: DC
  Administered 2017-12-23 – 2017-12-27 (×9): via NASAL
  Filled 2017-12-23 (×3): qty 22

## 2017-12-23 MED ORDER — HYDROMORPHONE HCL 1 MG/ML IJ SOLN
0.5000 mg | INTRAMUSCULAR | Status: AC | PRN
  Administered 2017-12-23 (×4): 0.5 mg via INTRAVENOUS

## 2017-12-23 MED ORDER — HYDROMORPHONE HCL 1 MG/ML IJ SOLN
0.5000 mg | INTRAMUSCULAR | Status: DC | PRN
  Administered 2017-12-23: 1 mg via INTRAVENOUS
  Filled 2017-12-23: qty 1

## 2017-12-23 MED ORDER — DEXAMETHASONE SODIUM PHOSPHATE 10 MG/ML IJ SOLN
INTRAMUSCULAR | Status: AC
  Filled 2017-12-23: qty 1

## 2017-12-23 MED ORDER — SUGAMMADEX SODIUM 200 MG/2ML IV SOLN
INTRAVENOUS | Status: AC
  Filled 2017-12-23: qty 2

## 2017-12-23 MED ORDER — SODIUM CHLORIDE 0.9 % IV SOLN: Freq: Once | INTRAVENOUS | Status: DC

## 2017-12-23 MED ORDER — 0.9 % SODIUM CHLORIDE (POUR BTL) OPTIME
TOPICAL | Status: DC | PRN
  Administered 2017-12-23: 1000 mL

## 2017-12-23 MED ORDER — ACETAMINOPHEN 325 MG PO TABS
650.0000 mg | ORAL_TABLET | Freq: Four times a day (QID) | ORAL | Status: DC
  Administered 2017-12-23 – 2017-12-27 (×15): 650 mg via ORAL
  Filled 2017-12-23 (×15): qty 2

## 2017-12-23 MED ORDER — HYDROMORPHONE HCL 1 MG/ML IJ SOLN
INTRAMUSCULAR | Status: DC | PRN
  Administered 2017-12-23 (×2): 0.5 mg via INTRAVENOUS

## 2017-12-23 MED ORDER — SUGAMMADEX SODIUM 200 MG/2ML IV SOLN
INTRAVENOUS | Status: DC | PRN
  Administered 2017-12-23: 200 mg via INTRAVENOUS

## 2017-12-23 MED ORDER — PROPOFOL 10 MG/ML IV BOLUS
INTRAVENOUS | Status: DC | PRN
  Administered 2017-12-23: 160 mg via INTRAVENOUS

## 2017-12-23 MED ORDER — PHENYLEPHRINE 40 MCG/ML (10ML) SYRINGE FOR IV PUSH (FOR BLOOD PRESSURE SUPPORT)
PREFILLED_SYRINGE | INTRAVENOUS | Status: DC | PRN
  Administered 2017-12-23 (×2): 80 ug via INTRAVENOUS

## 2017-12-23 MED ORDER — PROPOFOL 10 MG/ML IV BOLUS
INTRAVENOUS | Status: AC
  Filled 2017-12-23: qty 20

## 2017-12-23 MED ORDER — SODIUM CHLORIDE 0.9 % IJ SOLN
INTRAMUSCULAR | Status: AC
  Filled 2017-12-23: qty 10

## 2017-12-23 MED ORDER — HYDROMORPHONE HCL 1 MG/ML IJ SOLN
INTRAMUSCULAR | Status: AC
Start: 2017-12-23 — End: 2017-12-23
  Filled 2017-12-23: qty 0.5

## 2017-12-23 MED ORDER — LIDOCAINE 2% (20 MG/ML) 5 ML SYRINGE
INTRAMUSCULAR | Status: DC | PRN
  Administered 2017-12-23: 80 mg via INTRAVENOUS

## 2017-12-23 SURGICAL SUPPLY — 109 items
BANDAGE ACE 4X5 VEL STRL LF (GAUZE/BANDAGES/DRESSINGS) ×2 IMPLANT
BIT DRILL 4.3 (BIT) ×2
BIT DRILL 4.3X300MM (BIT) ×1 IMPLANT
BIT DRILL 4.9 CANNULATED (BIT) ×2
BIT DRILL AO MATTA 2.5MX230M (BIT) ×1 IMPLANT
BIT DRILL CANN QC 4.9 LRG (BIT) ×2 IMPLANT
BIT DRILL LONG 3.3 (BIT) ×2 IMPLANT
BIT DRILL QC 3.3X195 (BIT) ×4 IMPLANT
BIT DRILL TWST MATTA 3.5MX195M (BIT) ×1 IMPLANT
BLADE CLIPPER SURG (BLADE) IMPLANT
BNDG COHESIVE 4X5 TAN STRL (GAUZE/BANDAGES/DRESSINGS) ×2 IMPLANT
BNDG GAUZE ELAST 4 BULKY (GAUZE/BANDAGES/DRESSINGS) IMPLANT
BNDG STRETCH 4X75 NS LF (GAUZE/BANDAGES/DRESSINGS) ×2 IMPLANT
BONE CANC CHIPS 40CC CAN1/2 (Bone Implant) ×2 IMPLANT
BRUSH SCRUB SURG 4.25 DISP (MISCELLANEOUS) ×4 IMPLANT
CANISTER WOUND CARE 500ML ATS (WOUND CARE) ×2 IMPLANT
CAP LOCK NCB (Cap) ×12 IMPLANT
CHIPS CANC BONE 40CC CAN1/2 (Bone Implant) ×1 IMPLANT
COVER SURGICAL LIGHT HANDLE (MISCELLANEOUS) ×2 IMPLANT
DRAIN CHANNEL 15F RND FF W/TCR (WOUND CARE) IMPLANT
DRAPE C-ARM 42X72 X-RAY (DRAPES) ×2 IMPLANT
DRAPE C-ARMOR (DRAPES) ×2 IMPLANT
DRAPE HALF SHEET 40X57 (DRAPES) IMPLANT
DRAPE INCISE IOBAN 66X45 STRL (DRAPES) IMPLANT
DRAPE LAPAROTOMY TRNSV 102X78 (DRAPE) ×2 IMPLANT
DRAPE ORTHO SPLIT 77X108 STRL (DRAPES) ×2
DRAPE SURG 17X23 STRL (DRAPES) ×2 IMPLANT
DRAPE SURG ORHT 6 SPLT 77X108 (DRAPES) ×2 IMPLANT
DRAPE U-SHAPE 47X51 STRL (DRAPES) ×2 IMPLANT
DRILL BIT AO MATTA 2.5MX230M (BIT) ×2
DRILL BIT CANNULATED 4.9 (BIT) ×2
DRILL TWIST AO MATTA 3.5MX195M (BIT) ×2
DRSG MEPILEX BORDER 4X4 (GAUZE/BANDAGES/DRESSINGS) ×2 IMPLANT
DRSG MEPILEX BORDER 4X8 (GAUZE/BANDAGES/DRESSINGS) ×2 IMPLANT
DRSG MEPITEL 4X7.2 (GAUZE/BANDAGES/DRESSINGS) ×2 IMPLANT
DRSG PAD ABDOMINAL 8X10 ST (GAUZE/BANDAGES/DRESSINGS) IMPLANT
DRSG VAC ATS LRG SENSATRAC (GAUZE/BANDAGES/DRESSINGS) IMPLANT
DRSG VAC ATS MED SENSATRAC (GAUZE/BANDAGES/DRESSINGS) IMPLANT
DRSG VAC ATS SM SENSATRAC (GAUZE/BANDAGES/DRESSINGS) IMPLANT
ELECT REM PT RETURN 9FT ADLT (ELECTROSURGICAL) ×4
ELECTRODE REM PT RTRN 9FT ADLT (ELECTROSURGICAL) ×2 IMPLANT
EVACUATOR SILICONE 100CC (DRAIN) IMPLANT
GAUZE SPONGE 4X4 12PLY STRL (GAUZE/BANDAGES/DRESSINGS) ×2 IMPLANT
GAUZE XEROFORM 5X9 LF (GAUZE/BANDAGES/DRESSINGS) ×2 IMPLANT
GLOVE BIO SURGEON STRL SZ 6.5 (GLOVE) ×6 IMPLANT
GLOVE BIO SURGEON STRL SZ7.5 (GLOVE) ×2 IMPLANT
GLOVE BIO SURGEON STRL SZ8 (GLOVE) ×2 IMPLANT
GLOVE BIOGEL PI IND STRL 7.5 (GLOVE) ×1 IMPLANT
GLOVE BIOGEL PI IND STRL 8 (GLOVE) ×2 IMPLANT
GLOVE BIOGEL PI INDICATOR 7.5 (GLOVE) ×1
GLOVE BIOGEL PI INDICATOR 8 (GLOVE) ×2
GLOVE SURG SS PI 6.5 STRL IVOR (GLOVE) ×4 IMPLANT
GOWN STRL REUS W/ TWL LRG LVL3 (GOWN DISPOSABLE) ×2 IMPLANT
GOWN STRL REUS W/ TWL XL LVL3 (GOWN DISPOSABLE) ×1 IMPLANT
GOWN STRL REUS W/TWL LRG LVL3 (GOWN DISPOSABLE) ×2
GOWN STRL REUS W/TWL XL LVL3 (GOWN DISPOSABLE) ×1
GUIDEWIRE ASNIS 3.2 NONCAL (WIRE) ×4 IMPLANT
HANDPIECE INTERPULSE COAX TIP (DISPOSABLE) ×1
IMMOBILIZER KNEE 24 THIGH 36 (MISCELLANEOUS) ×1 IMPLANT
IMMOBILIZER KNEE 24 UNIV (MISCELLANEOUS) ×2
K-WIRE 2.0 (WIRE) ×2
K-WIRE FXSTD 280X2XNS SS (WIRE) ×2
KIT BASIN OR (CUSTOM PROCEDURE TRAY) ×2 IMPLANT
KIT ROOM TURNOVER OR (KITS) ×2 IMPLANT
KWIRE FXSTD 280X2XNS SS (WIRE) ×2 IMPLANT
MANIFOLD NEPTUNE II (INSTRUMENTS) ×2 IMPLANT
NS IRRIG 1000ML POUR BTL (IV SOLUTION) ×2 IMPLANT
PACK TOTAL JOINT (CUSTOM PROCEDURE TRAY) ×2 IMPLANT
PAD ABD 8X10 STRL (GAUZE/BANDAGES/DRESSINGS) ×2 IMPLANT
PAD ARMBOARD 7.5X6 YLW CONV (MISCELLANEOUS) ×4 IMPLANT
PAD CAST 4YDX4 CTTN HI CHSV (CAST SUPPLIES) ×1 IMPLANT
PADDING CAST COTTON 4X4 STRL (CAST SUPPLIES) ×1
PADDING CAST COTTON 6X4 STRL (CAST SUPPLIES) IMPLANT
PIN APEX 200 EXFIX (EXFIX) ×2 IMPLANT
PLATE ACET STRT 142.5M 12H (Plate) ×2 IMPLANT
PLATE DIST FEM 12H (Plate) ×2 IMPLANT
SCREW 5.0 60MM (Screw) ×4 IMPLANT
SCREW 5.0 70MM (Screw) ×4 IMPLANT
SCREW CANNULATED 8.0X145MM (Screw) ×2 IMPLANT
SCREW CORTEX ST MATTA 3.5X16MM (Screw) ×2 IMPLANT
SCREW CORTEX ST MATTA 3.5X18MM (Screw) ×2 IMPLANT
SCREW CORTEX ST MATTA 3.5X20 (Screw) ×2 IMPLANT
SCREW CORTEX ST MATTA 3.5X24 (Screw) ×4 IMPLANT
SCREW CORTEX ST MATTA 3.5X28MM (Screw) ×6 IMPLANT
SCREW CORTEX ST MATTA 3.5X30MM (Screw) ×2 IMPLANT
SCREW NCB 3.5X75X5X6.2XST (Screw) ×2 IMPLANT
SCREW NCB 4.0MX30M (Screw) ×8 IMPLANT
SCREW NCB 5.0X30MM (Screw) ×4 IMPLANT
SCREW NCB 5.0X75MM (Screw) ×2 IMPLANT
SET HNDPC FAN SPRY TIP SCT (DISPOSABLE) ×1 IMPLANT
SPONGE LAP 18X18 X RAY DECT (DISPOSABLE) ×2 IMPLANT
STAPLER VISISTAT 35W (STAPLE) ×2 IMPLANT
STOCKINETTE IMPERVIOUS 9X36 MD (GAUZE/BANDAGES/DRESSINGS) ×2 IMPLANT
SUCTION FRAZIER HANDLE 10FR (MISCELLANEOUS) ×1
SUCTION TUBE FRAZIER 10FR DISP (MISCELLANEOUS) ×1 IMPLANT
SUT ETHILON 2 0 PSLX (SUTURE) ×4 IMPLANT
SUT PDS AB 2-0 CT1 27 (SUTURE) IMPLANT
SUT VIC AB 0 CT1 27 (SUTURE) ×1
SUT VIC AB 0 CT1 27XBRD ANBCTR (SUTURE) ×1 IMPLANT
SUT VIC AB 1 CT1 18XCR BRD 8 (SUTURE) ×1 IMPLANT
SUT VIC AB 1 CT1 8-18 (SUTURE) ×1
SUT VIC AB 2-0 CT1 27 (SUTURE)
SUT VIC AB 2-0 CT1 TAPERPNT 27 (SUTURE) IMPLANT
SWAB CULTURE ESWAB REG 1ML (MISCELLANEOUS) IMPLANT
TOWEL OR 17X24 6PK STRL BLUE (TOWEL DISPOSABLE) ×2 IMPLANT
TOWEL OR 17X26 10 PK STRL BLUE (TOWEL DISPOSABLE) ×4 IMPLANT
UNDERPAD 30X30 (UNDERPADS AND DIAPERS) ×2 IMPLANT
WASHER SCREW MATTA SS 13.0X1.5 (Washer) ×2 IMPLANT
WATER STERILE IRR 1000ML POUR (IV SOLUTION) IMPLANT

## 2017-12-23 NOTE — Anesthesia Preprocedure Evaluation (Signed)
Anesthesia Evaluation  Patient identified by MRN, date of birth, ID bandGeneral Assessment Comment:Drowsy from pain meds. Answers questions. Family at bedside to confirm   Reviewed: Allergy & Precautions, NPO status , Patient's Chart, lab work & pertinent test results  History of Anesthesia Complications Negative for: history of anesthetic complications  Airway Mallampati: I  TM Distance: >3 FB Neck ROM: Full    Dental  (+) Teeth Intact, Dental Advisory Given,    Pulmonary neg pulmonary ROS,    Pulmonary exam normal        Cardiovascular negative cardio ROS   Rhythm:Regular Rate:Tachycardia     Neuro/Psych negative neurological ROS  negative psych ROS   GI/Hepatic negative GI ROS, Grade 1 liver lac   Endo/Other  negative endocrine ROS  Renal/GU negative Renal ROS     Musculoskeletal Pelvic fx, open left distal femur fx   Abdominal   Peds  Hematology  (+) anemia ,   Anesthesia Other Findings Right upper tooth with cap/repair per family  Reproductive/Obstetrics                             Anesthesia Physical  Anesthesia Plan  ASA: I  Anesthesia Plan: General   Post-op Pain Management:    Induction: Intravenous  PONV Risk Score and Plan: 3 and Ondansetron, Dexamethasone, Treatment may vary due to age or medical condition and Midazolam  Airway Management Planned: Oral ETT  Additional Equipment: None  Intra-op Plan:   Post-operative Plan: Extubation in OR  Informed Consent: I have reviewed the patients History and Physical, chart, labs and discussed the procedure including the risks, benefits and alternatives for the proposed anesthesia with the patient or authorized representative who has indicated his/her understanding and acceptance.   Dental advisory given  Plan Discussed with: CRNA  Anesthesia Plan Comments:        Anesthesia Quick Evaluation
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
## 2017-12-23 NOTE — Progress Notes (Signed)
Paged Trauma at this time MD Cornett places new orders for pain control.

## 2017-12-23 NOTE — Progress Notes (Signed)
Pt transported to pre-op with mother at this time.

## 2017-12-23 NOTE — Progress Notes (Signed)
Orthopedic Tech Progress Note Patient Details:  [name] 08/18/1991 [id number]  Ortho Devices Type of Ortho Device: Bone foam zero knee Ortho Device/Splint Location: LLE Ortho Device/Splint Interventions: Ordered   Post Interventions Patient Tolerated: Well Instructions Provided: Care of device, Adjustment of device   Jennye MoccasinHughes, [name] 12/23/2017, 9:33 PM

## 2017-12-23 NOTE — Plan of Care (Signed)
  Progressing Education: Knowledge of General Education information will improve 12/23/2017 1033 - Progressing by Quentin CornwallMadison, Tyriek Hofman, RN Health Behavior/Discharge Planning: Ability to manage health-related needs will improve 12/23/2017 1033 - Progressing by Quentin CornwallMadison, Hartlyn Reigel, RN Clinical Measurements: Ability to maintain clinical measurements within normal limits will improve 12/23/2017 1033 - Progressing by Quentin CornwallMadison, Merla Sawka, RN Will remain free from infection 12/23/2017 1033 - Progressing by Quentin CornwallMadison, Deacon Gadbois, RN Diagnostic test results will improve 12/23/2017 1033 - Progressing by Quentin CornwallMadison, Treyce Spillers, RN Respiratory complications will improve 12/23/2017 1033 - Progressing by Quentin CornwallMadison, Shaeley Segall, RN Cardiovascular complication will be avoided 12/23/2017 1033 - Progressing by Quentin CornwallMadison, Calle Schader, RN Activity: Risk for activity intolerance will decrease 12/23/2017 1033 - Progressing by Quentin CornwallMadison, Ashawna Hanback, RN Nutrition: Adequate nutrition will be maintained 12/23/2017 1033 - Progressing by Quentin CornwallMadison, Brylinn Teaney, RN Coping: Level of anxiety will decrease 12/23/2017 1033 - Progressing by Quentin CornwallMadison, Camilo Mander, RN Elimination: Will not experience complications related to bowel motility 12/23/2017 1033 - Progressing by Quentin CornwallMadison, Aariv Medlock, RN Will not experience complications related to urinary retention 12/23/2017 1033 - Progressing by Quentin CornwallMadison, Ilya Neely, RN Pain Managment: General experience of comfort will improve 12/23/2017 1033 - Progressing by Quentin CornwallMadison, Talbert Trembath, RN Safety: Ability to remain free from injury will improve 12/23/2017 1033 - Progressing by Quentin CornwallMadison, Kandi Brusseau, RN Skin Integrity: Risk for impaired skin integrity will decrease 12/23/2017 1033 - Progressing by Quentin CornwallMadison, Osmin Welz, RN
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
## 2017-12-23 NOTE — Social Work (Signed)
CSW was advised by RN that patient needed assistance with medicaid.  CSW called financial counseling and they advised that they are following up with patient.  Keene BreathPatricia Keston Seever, LCSW Clinical Social Worker [phone]

## 2017-12-23 NOTE — Transfer of Care (Addendum)
Immediate Anesthesia Transfer of Care Note  Patient: [name]  Procedure(s) Performed: ORIF PELVIC FRACTURE WITH LEFT SI SCREWS, ADJUSTMENT OF LEFT SPANNING EXTERNAL FIXATOR OF THE KNEE (Left ) IRRIGATION AND DEBRIDEMENT LEFT THIGH WOUND AND CLOSURE (Left ) POSSIBLE APPLICATION OF WOUND VAC LEFT THIGH (Left )  Patient Location:  Anesthesia Type:  Level of Consciousness:   Airway & Oxygen Therapy:   Post-op Assessment:   Post vital signs:   Last Vitals:  Vitals:   12/22/17 2151 12/23/17 0755  BP: (!) 114/59 137/86  Pulse: 73 99  Resp: 16 16  Temp: 37 C 37.1 C  SpO2: 99% 100%    Last Pain:  Vitals:   12/23/17 1100  TempSrc:   PainSc: 10-Worst pain ever      Patients Stated Pain Goal: 3 (12/23/17 1100)  Complications:

## 2017-12-24 ENCOUNTER — Inpatient Hospital Stay (HOSPITAL_COMMUNITY): Payer: No Typology Code available for payment source

## 2017-12-24 ENCOUNTER — Encounter (HOSPITAL_COMMUNITY): Payer: Self-pay | Admitting: Orthopedic Surgery

## 2017-12-24 DIAGNOSIS — S92902A Unspecified fracture of left foot, initial encounter for closed fracture: Secondary | ICD-10-CM

## 2017-12-24 DIAGNOSIS — S3282XA Multiple fractures of pelvis without disruption of pelvic ring, initial encounter for closed fracture: Secondary | ICD-10-CM

## 2017-12-24 HISTORY — DX: Unspecified fracture of left foot, initial encounter for closed fracture: S92.902A

## 2017-12-24 HISTORY — DX: Multiple fractures of pelvis without disruption of pelvic ring, initial encounter for closed fracture: S32.82XA

## 2017-12-24 LAB — BPAM RBC
Blood Product Expiration Date: 201902042359
ISSUE DATE / TIME: 201901141612
Unit Type and Rh: 5100

## 2017-12-24 LAB — CBC
HCT: 22.7 % — ABNORMAL LOW (ref 36.0–46.0)
Hemoglobin: 7.9 g/dL — ABNORMAL LOW (ref 12.0–15.0)
MCH: 31.5 pg (ref 26.0–34.0)
MCHC: 34.8 g/dL (ref 30.0–36.0)
MCV: 90.4 fL (ref 78.0–100.0)
Platelets: 117 10*3/uL — ABNORMAL LOW (ref 150–400)
RBC: 2.51 MIL/uL — ABNORMAL LOW (ref 3.87–5.11)
RDW: 15.8 % — AB (ref 11.5–15.5)
WBC: 10.9 10*3/uL — AB (ref 4.0–10.5)

## 2017-12-24 LAB — COMPREHENSIVE METABOLIC PANEL
ALT: 72 U/L — AB (ref 14–54)
AST: 66 U/L — AB (ref 15–41)
Albumin: 2.6 g/dL — ABNORMAL LOW (ref 3.5–5.0)
Alkaline Phosphatase: 44 U/L (ref 38–126)
Anion gap: 7 (ref 5–15)
BILIRUBIN TOTAL: 0.6 mg/dL (ref 0.3–1.2)
CO2: 26 mmol/L (ref 22–32)
CREATININE: 0.53 mg/dL (ref 0.44–1.00)
Calcium: 8 mg/dL — ABNORMAL LOW (ref 8.9–10.3)
Chloride: 103 mmol/L (ref 101–111)
GFR calc Af Amer: >60 mL/min (ref >60)
Glucose, Bld: 126 mg/dL — ABNORMAL HIGH (ref 65–99)
POTASSIUM: 4.5 mmol/L (ref 3.5–5.1)
Sodium: 136 mmol/L (ref 135–145)
TOTAL PROTEIN: 4.8 g/dL — AB (ref 6.5–8.1)

## 2017-12-24 LAB — TYPE AND SCREEN
ABO/RH(D): O POS
ANTIBODY SCREEN: NEGATIVE
UNIT DIVISION: 0

## 2017-12-24 MED ORDER — POLYETHYLENE GLYCOL 3350 17 G PO PACK: 17.0000 g | PACK | Freq: Every day | ORAL | Status: DC | PRN

## 2017-12-24 MED ORDER — DOCUSATE SODIUM 100 MG PO CAPS
100.0000 mg | ORAL_CAPSULE | Freq: Two times a day (BID) | ORAL | Status: DC
  Administered 2017-12-24 – 2017-12-27 (×7): 100 mg via ORAL
  Filled 2017-12-24 (×7): qty 1

## 2017-12-24 MED ORDER — PROSIGHT PO TABS
1.0000 | ORAL_TABLET | Freq: Every day | ORAL | Status: DC
  Administered 2017-12-24 – 2017-12-27 (×4): 1 via ORAL
  Filled 2017-12-24 (×4): qty 1

## 2017-12-24 MED ORDER — FERROUS GLUCONATE 324 (38 FE) MG PO TABS
324.0000 mg | ORAL_TABLET | Freq: Two times a day (BID) | ORAL | Status: DC
  Administered 2017-12-24 – 2017-12-27 (×7): 324 mg via ORAL
  Filled 2017-12-24 (×8): qty 1

## 2017-12-24 MED ORDER — HYDROMORPHONE HCL 1 MG/ML IJ SOLN
0.5000 mg | INTRAMUSCULAR | Status: DC | PRN
  Administered 2017-12-24 – 2017-12-27 (×18): 1 mg via INTRAVENOUS
  Filled 2017-12-24 (×19): qty 1

## 2017-12-24 NOTE — Progress Notes (Signed)
Orthopedic Tech Progress Note Patient Details:  Michele DoloresDarian Pajak 04/26/1991 [id number]  Patient ID: Michele Doloresarian Mcguffin, female   DOB: 01/28/1991, 27 y.o.   MRN: [id number]   [name], [name] 12/24/2017, 9:23 AM Called in [facility] brace order; spoke with Morrie SheldonAshley
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
## 2017-12-25 ENCOUNTER — Other Ambulatory Visit: Payer: Self-pay

## 2017-12-25 ENCOUNTER — Encounter (HOSPITAL_COMMUNITY): Payer: Self-pay | Admitting: General Practice

## 2017-12-25 ENCOUNTER — Encounter (HOSPITAL_COMMUNITY): Admission: EM | Disposition: A | Discharge status: Rehabilitation Facility | Payer: Self-pay | Source: Home / Self Care

## 2017-12-25 ENCOUNTER — Inpatient Hospital Stay (HOSPITAL_COMMUNITY): Payer: No Typology Code available for payment source

## 2017-12-25 LAB — CBC
HCT: 23.2 % — ABNORMAL LOW (ref 36.0–46.0)
Hemoglobin: 7.7 g/dL — ABNORMAL LOW (ref 12.0–15.0)
MCH: 30.4 pg (ref 26.0–34.0)
MCHC: 33.2 g/dL (ref 30.0–36.0)
MCV: 91.7 fL (ref 78.0–100.0)
Platelets: 138 10*3/uL — ABNORMAL LOW (ref 150–400)
RBC: 2.53 MIL/uL — AB (ref 3.87–5.11)
RDW: 15.2 % (ref 11.5–15.5)
WBC: 8.1 10*3/uL (ref 4.0–10.5)

## 2017-12-25 SURGERY — OPEN REDUCTION INTERNAL FIXATION (ORIF) DISTAL FEMUR FRACTURE
Anesthesia: General | Laterality: Left

## 2017-12-25 MED ORDER — POLYETHYLENE GLYCOL 3350 17 G PO PACK
17.0000 g | PACK | Freq: Once | ORAL | Status: AC
  Administered 2017-12-25: 17 g via ORAL
  Filled 2017-12-25: qty 1
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
## 2017-12-25 NOTE — Anesthesia Postprocedure Evaluation (Signed)
Anesthesia Post Note  Patient: [name]  Procedure(s) Performed: ORIF PELVIC FRACTURE WITH LEFT SI SCREWS, ADJUSTMENT OF LEFT SPANNING EXTERNAL FIXATOR OF THE KNEE (Left ) IRRIGATION AND DEBRIDEMENT LEFT THIGH WOUND AND CLOSURE (Left )     Patient location during evaluation: PACU Anesthesia Type: General Level of consciousness: awake and alert, sedated and patient cooperative Pain management: pain level controlled Vital Signs Assessment: post-procedure vital signs reviewed and stable Respiratory status: spontaneous breathing, nonlabored ventilation, respiratory function stable and patient connected to nasal cannula oxygen Cardiovascular status: blood pressure returned to baseline and stable Postop Assessment: no apparent nausea or vomiting Anesthetic complications: no    Last Vitals:  Vitals:   12/25/17 0434 12/25/17 0500  BP: 137/83 (!) 142/88  Pulse: 95 97  Resp: 16 15  Temp: 37.1 C 36.9 C  SpO2: 100% 100%    Last Pain:  Vitals:   12/25/17 0849  TempSrc:   PainSc: 8                  Aleck Locklin,E. Keyonda Bickle
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
## 2017-12-26 DIAGNOSIS — S060X9A Concussion with loss of consciousness of unspecified duration, initial encounter: Secondary | ICD-10-CM

## 2017-12-26 DIAGNOSIS — S3282XA Multiple fractures of pelvis without disruption of pelvic ring, initial encounter for closed fracture: Secondary | ICD-10-CM

## 2017-12-26 DIAGNOSIS — S72402A Unspecified fracture of lower end of left femur, initial encounter for closed fracture: Secondary | ICD-10-CM

## 2017-12-26 DIAGNOSIS — S92902A Unspecified fracture of left foot, initial encounter for closed fracture: Secondary | ICD-10-CM

## 2017-12-26 LAB — CBC
HEMATOCRIT: 25.5 % — AB (ref 36.0–46.0)
HEMOGLOBIN: 8.6 g/dL — AB (ref 12.0–15.0)
MCH: 31.2 pg (ref 26.0–34.0)
MCHC: 33.7 g/dL (ref 30.0–36.0)
MCV: 92.4 fL (ref 78.0–100.0)
Platelets: 190 10*3/uL (ref 150–400)
RBC: 2.76 MIL/uL — ABNORMAL LOW (ref 3.87–5.11)
RDW: 15 % (ref 11.5–15.5)
WBC: 7.6 10*3/uL (ref 4.0–10.5)

## 2017-12-26 MED ORDER — BETHANECHOL CHLORIDE 25 MG PO TABS
25.0000 mg | ORAL_TABLET | Freq: Three times a day (TID) | ORAL | Status: DC
  Administered 2017-12-26 – 2017-12-27 (×4): 25 mg via ORAL
  Filled 2017-12-26 (×4): qty 1

## 2017-12-26 MED ORDER — POLYETHYLENE GLYCOL 3350 17 G PO PACK
17.0000 g | PACK | Freq: Every day | ORAL | Status: DC
  Administered 2017-12-26 – 2017-12-27 (×2): 17 g via ORAL
  Filled 2017-12-26 (×2): qty 1
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
## 2017-12-26 NOTE — Progress Notes (Signed)
I will follow up with pt and family tomorrow to begin discussions concerning a possible inpt rehab admission. [phone]
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
## 2017-12-26 NOTE — Progress Notes (Signed)
MD on call notified that patient was bladder scanned for 647 mls this was the second time that patient was scanned and I &O cath was done earlier. Order to insert foley for urine retention. Ilean SkillVeronica Gevin Perea LPN

## 2017-12-27 ENCOUNTER — Encounter (HOSPITAL_COMMUNITY): Payer: Self-pay | Admitting: Physical Medicine and Rehabilitation

## 2017-12-27 ENCOUNTER — Inpatient Hospital Stay (HOSPITAL_COMMUNITY)
Admission: RE | Admit: 2017-12-27 | Discharge: 2018-01-03 | DRG: 560 | Disposition: A | Discharge status: Home Health | Payer: No Typology Code available for payment source | Source: Intra-hospital | Attending: Physical Medicine & Rehabilitation | Admitting: Physical Medicine & Rehabilitation

## 2017-12-27 DIAGNOSIS — S069X9A Unspecified intracranial injury with loss of consciousness of unspecified duration, initial encounter: Secondary | ICD-10-CM

## 2017-12-27 DIAGNOSIS — E8809 Other disorders of plasma-protein metabolism, not elsewhere classified: Secondary | ICD-10-CM | POA: Diagnosis present

## 2017-12-27 DIAGNOSIS — N390 Urinary tract infection, site not specified: Secondary | ICD-10-CM | POA: Diagnosis not present

## 2017-12-27 DIAGNOSIS — F419 Anxiety disorder, unspecified: Secondary | ICD-10-CM | POA: Diagnosis present

## 2017-12-27 DIAGNOSIS — S36113D Laceration of liver, unspecified degree, subsequent encounter: Secondary | ICD-10-CM

## 2017-12-27 DIAGNOSIS — Z87820 Personal history of traumatic brain injury: Secondary | ICD-10-CM

## 2017-12-27 DIAGNOSIS — D62 Acute posthemorrhagic anemia: Secondary | ICD-10-CM | POA: Diagnosis present

## 2017-12-27 DIAGNOSIS — F1290 Cannabis use, unspecified, uncomplicated: Secondary | ICD-10-CM | POA: Diagnosis present

## 2017-12-27 DIAGNOSIS — F17210 Nicotine dependence, cigarettes, uncomplicated: Secondary | ICD-10-CM | POA: Diagnosis present

## 2017-12-27 DIAGNOSIS — S92902D Unspecified fracture of left foot, subsequent encounter for fracture with routine healing: Secondary | ICD-10-CM

## 2017-12-27 DIAGNOSIS — Z6821 Body mass index (BMI) 21.0-21.9, adult: Secondary | ICD-10-CM

## 2017-12-27 DIAGNOSIS — B9620 Unspecified Escherichia coli [E. coli] as the cause of diseases classified elsewhere: Secondary | ICD-10-CM | POA: Diagnosis not present

## 2017-12-27 DIAGNOSIS — K5900 Constipation, unspecified: Secondary | ICD-10-CM | POA: Diagnosis present

## 2017-12-27 DIAGNOSIS — E46 Unspecified protein-calorie malnutrition: Secondary | ICD-10-CM | POA: Diagnosis present

## 2017-12-27 DIAGNOSIS — T1490XA Injury, unspecified, initial encounter: Secondary | ICD-10-CM | POA: Diagnosis present

## 2017-12-27 DIAGNOSIS — S069XAA Unspecified intracranial injury with loss of consciousness status unknown, initial encounter: Secondary | ICD-10-CM

## 2017-12-27 DIAGNOSIS — Z8781 Personal history of (healed) traumatic fracture: Secondary | ICD-10-CM

## 2017-12-27 DIAGNOSIS — R740 Nonspecific elevation of levels of transaminase and lactic acid dehydrogenase [LDH]: Secondary | ICD-10-CM | POA: Diagnosis present

## 2017-12-27 DIAGNOSIS — S32810D Multiple fractures of pelvis with stable disruption of pelvic ring, subsequent encounter for fracture with routine healing: Secondary | ICD-10-CM | POA: Diagnosis present

## 2017-12-27 DIAGNOSIS — S7292XD Unspecified fracture of left femur, subsequent encounter for closed fracture with routine healing: Secondary | ICD-10-CM

## 2017-12-27 DIAGNOSIS — S069X1S Unspecified intracranial injury with loss of consciousness of 30 minutes or less, sequela: Secondary | ICD-10-CM

## 2017-12-27 DIAGNOSIS — R7401 Elevation of levels of liver transaminase levels: Secondary | ICD-10-CM

## 2017-12-27 LAB — URINALYSIS, COMPLETE (UACMP) WITH MICROSCOPIC
Bilirubin Urine: NEGATIVE
Glucose, UA: NEGATIVE mg/dL
Ketones, ur: NEGATIVE mg/dL
Nitrite: POSITIVE — AB
PROTEIN: NEGATIVE mg/dL
SPECIFIC GRAVITY, URINE: 1.025 (ref 1.005–1.030)
pH: 6 (ref 5.0–8.0)

## 2017-12-27 MED ORDER — PROSIGHT PO TABS
1.0000 | ORAL_TABLET | Freq: Every day | ORAL | Status: DC
  Administered 2017-12-28 – 2018-01-02 (×6): 1 via ORAL
  Filled 2017-12-27 (×6): qty 1

## 2017-12-27 MED ORDER — METHOCARBAMOL 500 MG PO TABS
500.0000 mg | ORAL_TABLET | Freq: Four times a day (QID) | ORAL | Status: DC | PRN
  Administered 2017-12-27 – 2018-01-03 (×20): 500 mg via ORAL
  Filled 2017-12-27 (×21): qty 1

## 2017-12-27 MED ORDER — PROCHLORPERAZINE 25 MG RE SUPP: 12.5000 mg | Freq: Four times a day (QID) | RECTAL | Status: DC | PRN

## 2017-12-27 MED ORDER — FLEET ENEMA 7-19 GM/118ML RE ENEM: 1.0000 | ENEMA | Freq: Once | RECTAL | Status: DC | PRN

## 2017-12-27 MED ORDER — LIDOCAINE HCL 2 % EX GEL: CUTANEOUS | Status: DC | PRN

## 2017-12-27 MED ORDER — BISACODYL 10 MG RE SUPP: 10.0000 mg | Freq: Every day | RECTAL | Status: DC | PRN

## 2017-12-27 MED ORDER — BETHANECHOL CHLORIDE 25 MG PO TABS
25.0000 mg | ORAL_TABLET | Freq: Three times a day (TID) | ORAL | Status: DC
  Administered 2017-12-27 – 2018-01-02 (×17): 25 mg via ORAL
  Filled 2017-12-27 (×17): qty 1

## 2017-12-27 MED ORDER — PANTOPRAZOLE SODIUM 20 MG PO TBEC
20.0000 mg | DELAYED_RELEASE_TABLET | Freq: Every day | ORAL | Status: DC
  Administered 2017-12-28 – 2018-01-03 (×7): 20 mg via ORAL
  Filled 2017-12-27 (×7): qty 1

## 2017-12-27 MED ORDER — ENOXAPARIN SODIUM 40 MG/0.4ML ~~LOC~~ SOLN
40.0000 mg | SUBCUTANEOUS | Status: DC
  Administered 2017-12-27 – 2018-01-02 (×7): 40 mg via SUBCUTANEOUS
  Filled 2017-12-27 (×8): qty 0.4

## 2017-12-27 MED ORDER — FLEET ENEMA 7-19 GM/118ML RE ENEM
1.0000 | ENEMA | Freq: Once | RECTAL | Status: AC
  Administered 2017-12-27: 1 via RECTAL
  Filled 2017-12-27: qty 1

## 2017-12-27 MED ORDER — ACETAMINOPHEN 325 MG PO TABS
325.0000 mg | ORAL_TABLET | ORAL | Status: DC | PRN
  Administered 2017-12-28 – 2017-12-29 (×2): 650 mg via ORAL
  Administered 2017-12-30: 325 mg via ORAL
  Administered 2017-12-31: 650 mg via ORAL
  Administered 2017-12-31 – 2018-01-01 (×2): 325 mg via ORAL
  Administered 2018-01-01 – 2018-01-02 (×3): 650 mg via ORAL
  Filled 2017-12-27 (×4): qty 2
  Filled 2017-12-27: qty 1
  Filled 2017-12-27 (×4): qty 2

## 2017-12-27 MED ORDER — POLYETHYLENE GLYCOL 3350 17 G PO PACK
17.0000 g | PACK | Freq: Every day | ORAL | Status: DC | PRN
  Administered 2017-12-27: 17 g via ORAL
  Filled 2017-12-27: qty 1

## 2017-12-27 MED ORDER — OXYCODONE HCL 5 MG PO TABS
5.0000 mg | ORAL_TABLET | ORAL | Status: DC | PRN
  Administered 2017-12-27 – 2018-01-02 (×31): 10 mg via ORAL
  Filled 2017-12-27 (×33): qty 2

## 2017-12-27 MED ORDER — FERROUS GLUCONATE 324 (38 FE) MG PO TABS
324.0000 mg | ORAL_TABLET | Freq: Two times a day (BID) | ORAL | Status: DC
  Administered 2017-12-27 – 2018-01-03 (×14): 324 mg via ORAL
  Filled 2017-12-27 (×15): qty 1

## 2017-12-27 MED ORDER — PROCHLORPERAZINE EDISYLATE 5 MG/ML IJ SOLN: 5.0000 mg | Freq: Four times a day (QID) | INTRAMUSCULAR | Status: DC | PRN

## 2017-12-27 MED ORDER — POLYETHYLENE GLYCOL 3350 17 G PO PACK
17.0000 g | PACK | Freq: Two times a day (BID) | ORAL | Status: DC
  Administered 2017-12-27 – 2018-01-02 (×11): 17 g via ORAL
  Filled 2017-12-27 (×13): qty 1

## 2017-12-27 MED ORDER — MUPIROCIN 2 % EX OINT
TOPICAL_OINTMENT | Freq: Two times a day (BID) | CUTANEOUS | Status: AC
  Administered 2017-12-27 – 2017-12-29 (×4): via NASAL
  Filled 2017-12-27: qty 22

## 2017-12-27 MED ORDER — TRAZODONE HCL 50 MG PO TABS
25.0000 mg | ORAL_TABLET | Freq: Every evening | ORAL | Status: DC | PRN
  Administered 2017-12-27 – 2018-01-02 (×7): 50 mg via ORAL
  Filled 2017-12-27 (×7): qty 1

## 2017-12-27 MED ORDER — GUAIFENESIN-DM 100-10 MG/5ML PO SYRP: 5.0000 mL | ORAL_SOLUTION | Freq: Four times a day (QID) | ORAL | Status: DC | PRN

## 2017-12-27 MED ORDER — ALUM & MAG HYDROXIDE-SIMETH 200-200-20 MG/5ML PO SUSP
30.0000 mL | ORAL | Status: DC | PRN
Start: 2017-12-27 — End: 2018-01-03

## 2017-12-27 MED ORDER — DIPHENHYDRAMINE HCL 12.5 MG/5ML PO ELIX: 12.5000 mg | ORAL_SOLUTION | Freq: Four times a day (QID) | ORAL | Status: DC | PRN

## 2017-12-27 MED ORDER — PROCHLORPERAZINE MALEATE 5 MG PO TABS: 5.0000 mg | ORAL_TABLET | Freq: Four times a day (QID) | ORAL | Status: DC | PRN
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
## 2017-12-27 NOTE — Progress Notes (Signed)
I met with pt and her Mom at bedside to discuss goals and expectations of an inpt rehab admit. They are in agreement. I will make the arrangements to admit and alert Trauma team to include RN CM and SW. [phone]
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
## 2017-12-27 NOTE — Progress Notes (Signed)
Patient and family were informed about rehab process including patient safety plan and rehab booklet. 

## 2017-12-28 ENCOUNTER — Inpatient Hospital Stay (HOSPITAL_COMMUNITY): Payer: No Typology Code available for payment source | Admitting: Physical Therapy

## 2017-12-28 ENCOUNTER — Inpatient Hospital Stay (HOSPITAL_COMMUNITY): Payer: Self-pay | Admitting: Occupational Therapy

## 2017-12-28 LAB — CBC WITH DIFFERENTIAL/PLATELET
Basophils Absolute: 0 10*3/uL (ref 0.0–0.1)
Basophils Relative: 0 %
EOS PCT: 1 %
Eosinophils Absolute: 0.1 10*3/uL (ref 0.0–0.7)
HCT: 27.9 % — ABNORMAL LOW (ref 36.0–46.0)
HEMOGLOBIN: 9.2 g/dL — AB (ref 12.0–15.0)
LYMPHS ABS: 1.7 10*3/uL (ref 0.7–4.0)
Lymphocytes Relative: 22 %
MCH: 30.8 pg (ref 26.0–34.0)
MCHC: 33 g/dL (ref 30.0–36.0)
MCV: 93.3 fL (ref 78.0–100.0)
MONOS PCT: 6 %
Monocytes Absolute: 0.5 10*3/uL (ref 0.1–1.0)
Neutro Abs: 5.5 10*3/uL (ref 1.7–7.7)
Neutrophils Relative %: 71 %
PLATELETS: 294 10*3/uL (ref 150–400)
RBC: 2.99 MIL/uL — ABNORMAL LOW (ref 3.87–5.11)
RDW: 15.6 % — ABNORMAL HIGH (ref 11.5–15.5)
WBC: 7.8 10*3/uL (ref 4.0–10.5)

## 2017-12-28 LAB — COMPREHENSIVE METABOLIC PANEL
ALK PHOS: 72 U/L (ref 38–126)
ALT: 46 U/L (ref 14–54)
ANION GAP: 9 (ref 5–15)
AST: 44 U/L — ABNORMAL HIGH (ref 15–41)
Albumin: 2.7 g/dL — ABNORMAL LOW (ref 3.5–5.0)
BILIRUBIN TOTAL: 0.8 mg/dL (ref 0.3–1.2)
BUN: 8 mg/dL (ref 6–20)
CALCIUM: 8.6 mg/dL — AB (ref 8.9–10.3)
CO2: 24 mmol/L (ref 22–32)
Chloride: 102 mmol/L (ref 101–111)
Creatinine, Ser: 0.51 mg/dL (ref 0.44–1.00)
GFR calc non Af Amer: >60 mL/min (ref >60)
Glucose, Bld: 112 mg/dL — ABNORMAL HIGH (ref 65–99)
POTASSIUM: 4.2 mmol/L (ref 3.5–5.1)
SODIUM: 135 mmol/L (ref 135–145)
TOTAL PROTEIN: 5.7 g/dL — AB (ref 6.5–8.1)

## 2017-12-28 MED ORDER — CEPHALEXIN 250 MG PO CAPS
250.0000 mg | ORAL_CAPSULE | Freq: Three times a day (TID) | ORAL | Status: DC
  Administered 2017-12-28 – 2018-01-03 (×19): 250 mg via ORAL
  Filled 2017-12-28 (×19): qty 1

## 2017-12-28 MED ORDER — TAMSULOSIN HCL 0.4 MG PO CAPS
0.4000 mg | ORAL_CAPSULE | Freq: Every day | ORAL | Status: DC
  Administered 2017-12-28 – 2018-01-02 (×6): 0.4 mg via ORAL
  Filled 2017-12-28 (×6): qty 1
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
## 2017-12-28 NOTE — Progress Notes (Signed)
Occupational Therapy Session Note  Patient Details  Name: [name] MRN: [id number] Date of Birth: [birth year]  Today's Date: 12/28/2017 OT Individual Time: 1345-1445 OT Individual Time Calculation (min): 60 min   Short Term Goals: Week 1:  OT Short Term Goal 1 (Week 1): STG=LTG 2/2 ELOS  Skilled Therapeutic Interventions/Progress Updates:  OT treatment session focused on wc propulsion, UB/LB there-ex and improved sit<>Stand. Pt transferred to sitting EOB with mod A and guided A for LLE. Pt then completed stand-pivot to wc on L with mod A. LE's elevated in wc for comfort, then pt propelled wc to therapy gym for UB there-ex. Pt completed 4 sit<>stands for card sorting activity. R LE there-ex 3 sets of 10 reps knee extension, hip flexion, and ankle pumps. W/c obstacle course with cones-pt needed min cues for correct use of wc to turn tight corners. Pt returned to room and plans to try to stay up in wc for a little while this afternoon. Pt left with family present and needs met.   Therapy Documentation Precautions:  Precautions Precautions: Fall Required Braces or Orthoses: Knee Immobilizer - Left Knee Immobilizer - Left: On at all times Restrictions Weight Bearing Restrictions: Yes RLE Weight Bearing: Weight bearing as tolerated LLE Weight Bearing: Non weight bearing Other Position/Activity Restrictions: transfers only Pain: Pain Assessment Pain Assessment: 0-10 Pain Score: 8  Pain Type: Acute pain Pain Location: Knee Pain Orientation: Left Pain Descriptors / Indicators: Aching Pain Onset: On-going Pain Intervention(s): Repositioned  See Function Navigator for Current Functional Status.   Therapy/Group: Individual Therapy  Valma Cava 12/28/2017, 2:55 PM

## 2017-12-29 ENCOUNTER — Inpatient Hospital Stay (HOSPITAL_COMMUNITY): Payer: Self-pay
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
## 2017-12-29 NOTE — Progress Notes (Signed)
Physical Therapy Session Note  Patient Details  Name: [name] MRN: [id number] Date of Birth: 09/27/1991  Today's Date: 12/29/2017 PT Individual Time: 1400-1445 PT Individual Time Calculation (min): 45 min   Short Term Goals: Week 1:  PT Short Term Goal 1 (Week 1): STGs = LTGs  Skilled Therapeutic Interventions/Progress Updates:    Pt supine in bed upon PT arrival, agreeable to therapy tx despite reporting 10/10 pain in L LE. Pt transferred from supine>sitting EOB with max assist to lift LEs and help bring trunk forward. Pt performed sit<>stand with mod assist and stand pivot with RW mod assist while maintaining NWB on L LE. Pt propelled w/c from room<>gym x 150 ft each direction with supervision using B UEs. Pt unable to tolerate movement of L LE this session. Pt performed 2 x 10 LAQ, hip flexion and hamstring curls with TB while seated in w/c with R LE for strengthening. Pt unable to continue through the rest of the session secondary to pain 10/10 in L LE, pt very tearful throughout session secondary to pain. Pt transferred back to bed with mod assist and sitting>supine with mod assist. RN notified of pt's pain and able to bring pain medication at 1615. Pt left supine with needs in reach.  Pt missed 15 minutes of skilled therapy tx this session secondary to pain 10/10 in L LE.   Therapy Documentation Precautions:  Precautions Precautions: Fall Required Braces or Orthoses: Knee Immobilizer - Left Knee Immobilizer - Left: On at all times Restrictions Weight Bearing Restrictions: Yes RLE Weight Bearing: Weight bearing as tolerated LLE Weight Bearing: Non weight bearing Other Position/Activity Restrictions: transfers only   General: PT Amount of Missed Time (min): 15 Minutes PT Missed Treatment Reason: Pain   See Function Navigator for Current Functional Status.   Therapy/Group: Individual Therapy  Cresenciano GenreEmily van Schagen, PT, DPT 12/29/2017, 4:53 PM

## 2017-12-30 ENCOUNTER — Inpatient Hospital Stay (HOSPITAL_COMMUNITY): Payer: Self-pay | Admitting: Occupational Therapy

## 2017-12-30 ENCOUNTER — Inpatient Hospital Stay (HOSPITAL_COMMUNITY): Payer: Self-pay

## 2017-12-30 ENCOUNTER — Inpatient Hospital Stay (HOSPITAL_COMMUNITY): Payer: No Typology Code available for payment source | Admitting: Physical Therapy

## 2017-12-30 DIAGNOSIS — R7401 Elevation of levels of liver transaminase levels: Secondary | ICD-10-CM

## 2017-12-30 DIAGNOSIS — N390 Urinary tract infection, site not specified: Secondary | ICD-10-CM

## 2017-12-30 DIAGNOSIS — E8809 Other disorders of plasma-protein metabolism, not elsewhere classified: Secondary | ICD-10-CM

## 2017-12-30 DIAGNOSIS — R740 Nonspecific elevation of levels of transaminase and lactic acid dehydrogenase [LDH]: Secondary | ICD-10-CM

## 2017-12-30 DIAGNOSIS — T1490XA Injury, unspecified, initial encounter: Secondary | ICD-10-CM

## 2017-12-30 DIAGNOSIS — E46 Unspecified protein-calorie malnutrition: Secondary | ICD-10-CM

## 2017-12-30 DIAGNOSIS — D62 Acute posthemorrhagic anemia: Secondary | ICD-10-CM

## 2017-12-30 LAB — URINE CULTURE

## 2017-12-30 MED ORDER — PRO-STAT SUGAR FREE PO LIQD
30.0000 mL | Freq: Two times a day (BID) | ORAL | Status: DC
  Administered 2017-12-30 – 2018-01-03 (×9): 30 mL via ORAL
  Filled 2017-12-30 (×7): qty 30
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
## 2017-12-30 NOTE — Progress Notes (Signed)
[facility] Individual Statement of Services  Patient Name:  [name]  Date:  12/30/2017  Welcome to the [facility].  Our goal is to provide you with an individualized program based on your diagnosis and situation, designed to meet your specific needs.  With this comprehensive rehabilitation program, you will be expected to participate in at least 3 hours of rehabilitation therapies Monday-Friday, with modified therapy programming on the weekends.  Your rehabilitation program will include the following services:  Physical Therapy (PT), Occupational Therapy (OT), 24 hour per day rehabilitation nursing, Neuropsychology, Case Management (Social Worker), Rehabilitation Medicine, Nutrition Services and Pharmacy Services  Weekly team conferences will be held on Wednesdays to discuss your progress.  Your Social Worker will talk with you frequently to get your input and to update you on team discussions.  Team conferences with you and your family in attendance may also be held.  Expected length of stay:  7 to 10 days  Overall anticipated outcome:  Modified independent with supervision needed for bathing  Depending on your progress and recovery, your program may change. Your Social Worker will coordinate services and will keep you informed of any changes. Your Social Worker's name and contact numbers are listed  below.  The following services may also be recommended but are not provided by the [facility]:   Driving Evaluations  Home Health Rehabiltiation Services  Outpatient Rehabilitation Services  Vocational Rehabilitation   Arrangements will be made to provide these services after discharge if needed.  Arrangements include referral to agencies that provide these services.  Your insurance has been verified to be:  None at this time.  You expect to have insurance after another month at work. Your primary doctor is:  You do not  have one at this time, so [name] will assist with community clinic referral until pt has her insurance.  Pertinent information will be shared with your doctor and your insurance company.  Social Worker:  Staci AcostaJenny Jaleiah Asay, LCSW  [phone] or ([phone]) [phone]  Information discussed with and copy given to patient by: [name], [name], 12/30/2017, 11:44 AM

## 2017-12-30 NOTE — Progress Notes (Signed)
Physical Therapy Session Note  Patient Details  Name: [name] MRN: [id number] Date of Birth: 02/11/1991  Today's Date: 12/30/2017 PT Individual Time: 1104-1200 PT Individual Time Calculation (min): 56 min   Short Term Goals: Week 1:  PT Short Term Goal 1 (Week 1): STGs = LTGs  Skilled Therapeutic Interventions/Progress Updates:  Pt received in bed with friend present. Session focused on pt education, d/c planning, transfers, and w/c mobility. Pt transfers supine>sitting EOB with bed features and min assist with therapist supporting LLE. Pt completes sit>stand from elevated bed and stand pivot bed>w/c with RW with min assist and pt adequately maintaining NWB LLE. Pt propelled w/c throughout unit with BUE & supervision; educated pt on proper sitting position in w/c. Pt propelled w/c between cones to simulate small spaces in home environment; pt reports improvement compared to previous date but would benefit from continued practice of obstacle avoidance in small environment. Attempted to use BUE ergometer but pt with too much pain in RUE. Pt propelled w/c over ramp in ortho gym with steady assist; therapist educated her on need for anterior weight shift while negotiating ramp but pt with difficulty doing this. Therapist educated pt on w/c parts management but pt with difficulty locking/unlocking brakes 2/2 pain with forward lean. Pt needs to practice w/c parts management prior to d/c as she only observed therapist on this date. Provided pt with home measurement sheet for her mother to complete and educated her on pressure relief techniques while sitting in w/c. At end of session pt left sitting in w/c in room with all needs within reach & visitor present.  Therapy Documentation Precautions:  Precautions Precautions: Fall Required Braces or Orthoses: Knee Immobilizer - Left Knee Immobilizer - Left: On at all times Restrictions Weight Bearing Restrictions: Yes RLE Weight Bearing: Weight  bearing as tolerated LLE Weight Bearing: Non weight bearing Other Position/Activity Restrictions: transfers only  Pain: 7/10 - RN administered meds, rest breaks provided PRN.  See Function Navigator for Current Functional Status.   Therapy/Group: Individual Therapy  Sandi MariscalVictoria M Miller 12/30/2017, 12:25 PM
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
## 2017-12-30 NOTE — Progress Notes (Signed)
Physical Therapy Session Note  Patient Details  Name: Konrad DoloresDarian Terlecki MRN: [id number] Date of Birth: 04/02/1991  Today's Date: 12/30/2017 PT Individual Time: 1300-1343 PT Individual Time Calculation (min): 43 min   Short Term Goals: Week 1:  PT Short Term Goal 1 (Week 1): STGs = LTGs  Skilled Therapeutic Interventions/Progress Updates:    Session focused on w/c mobility and education on parts management (pt attempted but unable to reach lever due to pain with anterior/lateral lean at this time for leg rests), stand pivot transfers with focus on technique and increasing independence with mobility (min assist overall for LLE management and assist for sit <>stand), bed mobility on flat bed with min to mod assist and unable to tolerate laying flat, so utilized pillows for support (educated on practicing in the hospital bed lowering the [facility] a gradually to increase tolerance as pt prefers to go home without a hospital bed), and supine PROM by PT for LLE. Pt very limited with LLE mobility due to pain and guarding/tightness. Encouraged distraction with conversation with pt's boyfriend and encouraged her not to watch as she was very fearful of any movement. Requires total assist for management of LLE during all mobility tasks. Added basic back to w/c for improved posture and increased sitting tolerance.   Therapy Documentation Precautions:  Precautions Precautions: Fall Required Braces or Orthoses: Knee Immobilizer - Left Knee Immobilizer - Left: On at all times Restrictions Weight Bearing Restrictions: Yes RLE Weight Bearing: Weight bearing as tolerated LLE Weight Bearing: Non weight bearing Other Position/Activity Restrictions: transfers only Pain: Premedicated for pain. Pt reports pain in BLE, Bilateral hips, pubic bone, and UE's.    See Function Navigator for Current Functional Status.   Therapy/Group: Individual Therapy  Karolee StampsGray, Dajuan Turnley Darrol PokeBrescia  Oryan Winterton B. Lorelie Biermann, PT, DPT  12/30/2017, 1:51 PM

## 2017-12-30 NOTE — Progress Notes (Signed)
Occupational Therapy Session Note  Patient Details  Name: [name] MRN: [id number] Date of Birth: 02/20/1991  Today's Date: 12/30/2017 OT Individual Time:  - 6213-0865- 0755-0852 and [id number] OT Individual Treatment Time Calculation: 57 min and 33 min    Short Term Goals: Week 1:  OT Short Term Goal 1 (Week 1): STG=LTG 2/2 ELOS  Skilled Therapeutic Interventions/Progress Updates:    Pt greeted supine in bed, agreeable to tx. Supine<sit with Mod A for guiding L LE off of bed. Pt completed UB self care with supervision. She then reported need to void and completed stand pivot<BSC with Mod A and RW. Needed to have her L LE positioned in blue foam for comfort post transfer. Unable to sit symmetrically on BSC due to hip pain. Post voiding bladder, pt able to complete pericare while seated. During 2nd transfer/void, pt required assist for hygiene while standing with RW post BM. Once she returned EOB, pt unable to assist with LB dressing due to pain and increased feelings of fear with moving/touching L LE. Sit<stand with Min A for lifting pants over hips. Pt completing grooming tasks while seated with setup. At end of tx pt requesting to return to bed. Assist provided for keeping L LE off of floor while laterally scooting up in bed. Mod A for transition back to supine for LE mgt. Once pt was repositioned for comfort, she was left with all needs and visitor present.   2nd Session 1:1 tx (33 min) Pt greeted in w/c with family present. Not yet due for pain medication. Tx focus on functional LE use to increase independence with LB dressing. Provided her with reacher and theraband for simulation purposes. Worked on pt lifting L LE in order to thread band over foot. This was very distressing and painful for her, induced tears. Educated pt/family on holistic pain mgt strategies, including diaphragmatic breathing, listening to self-selected music, and therapeutic room activities (that she has in drawers). Also discussed  how emotional states can heighten/dampen pain perception. Pt and family appeared receptive to education. Had pt practice threading band over bilateral LEs two more times, with increased ease and speed when compared to the first time. Also had her complete w/c push ups x5 reps for UB strengthening. Provided pt with ice pack to Lt knee for pain mgt at session exit. Left her with family.   Therapy Documentation Precautions:  Precautions Precautions: Fall Required Braces or Orthoses: Knee Immobilizer - Left Knee Immobilizer - Left: On at all times Restrictions Weight Bearing Restrictions: Yes RLE Weight Bearing: Weight bearing as tolerated LLE Weight Bearing: Non weight bearing Other Position/Activity Restrictions: transfers only Vital Signs: Therapy Vitals Temp: 98.6 F (37 C) Temp Source: Oral Pulse Rate: 94 Resp: 18 BP: 136/88 Patient Position (if appropriate): Lying Oxygen Therapy SpO2: 100 % O2 Device: Not Delivered Pain: C/o LE pain. RN made aware and provided medication during both sessions  Pain Assessment Pain Assessment: 0-10 Pain Score: Asleep Pain Type: Acute pain Pain Location: Leg Pain Orientation: Left Pain Descriptors / Indicators: Aching Pain Frequency: Intermittent Pain Onset: Awakened from sleep Pain Intervention(s): Medication (See eMAR) ADL: ADL ADL Comments: Please see functional navigator     See Function Navigator for Current Functional Status.   Therapy/Group: Individual Therapy  Valeta Paz A Chonte Ricke 12/30/2017, 7:16 AM

## 2017-12-31 ENCOUNTER — Inpatient Hospital Stay (HOSPITAL_COMMUNITY): Payer: Self-pay | Admitting: Physical Therapy

## 2017-12-31 ENCOUNTER — Inpatient Hospital Stay (HOSPITAL_COMMUNITY): Payer: Self-pay | Admitting: Occupational Therapy

## 2017-12-31 ENCOUNTER — Inpatient Hospital Stay (HOSPITAL_COMMUNITY): Payer: Self-pay

## 2017-12-31 DIAGNOSIS — B9620 Unspecified Escherichia coli [E. coli] as the cause of diseases classified elsewhere: Secondary | ICD-10-CM

## 2017-12-31 DIAGNOSIS — N390 Urinary tract infection, site not specified: Secondary | ICD-10-CM
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
## 2017-12-31 NOTE — Progress Notes (Signed)
Physical Therapy Session Note  Patient Details  Name: [name] MRN: [id number] Date of Birth: 10/06/1991  Today's Date: 12/31/2017 PT Individual Time: 1445-1530 PT Individual Time Calculation (min): 45 min   Short Term Goals: Week 1:  PT Short Term Goal 1 (Week 1): STGs = LTGs  Skilled Therapeutic Interventions/Progress Updates:    Session focused on family education with pt's mother (grandmother observed but did not engage in hands on training this session) in regards to basic transfers, w/c parts management and set-up, and bumping up step for home access. Educated on safe positioning and guarding and focused on pt directing care when assist was needed. Pt able to complete sit <> stands and transfers with supervision to steadying assist throughout session demonstrating good adherence to weightbearing precautions. Pt's mother able to safely return demonstrate all transfers and w/c parts management with cues. Checked off on safety plan. Discussed home environment set-up as well and access to first floor vs basement while pt's mother is at work.   Therapy Documentation Precautions:  Precautions Precautions: Fall Required Braces or Orthoses: Bledsoe brace unlocked - Left Left: On at all times Restrictions Weight Bearing Restrictions: Yes RLE Weight Bearing: Weight bearing as tolerated LLE Weight Bearing: Non weight bearing Other Position/Activity Restrictions: transfers only  Pain: Pain Assessment Pain Assessment: 0-10 Pain Score: 7  Faces Pain Scale: Hurts a little bit Pain Type: Surgical pain Pain Location: Leg Pain Orientation: Left Pain Intervention(s): Medication (See eMAR)   See Function Navigator for Current Functional Status.   Therapy/Group: Individual Therapy  [name], [name]  [name] B. [name], PT, DPT  12/31/2017, 3:44 PM

## 2017-12-31 NOTE — Progress Notes (Addendum)
Physical Therapy Note  Patient Details  Name: Konrad DoloresDarian Aries MRN: [id number] Date of Birth: 11/26/1991 Today's Date: 12/31/2017    Time: 830-924 54 minutes  1:1 No c/o pain, pt states she rec'd pain meds prior to session.  Bed mobility initially with min A for Lt LE management.  Pt then given LE lifter and pt was able to perform multiple reps of supine <> sit without assistance.  W/c mobility throughout unit and including ramp and obstacle negotiation with supervision.  Pt continues to require assist with w/c parts management.  Stand pivot transfers with close supervision to [facility], w/c and mat table in gym.  Supine therex with hip abd/add, SLR and ankle AAROM as tolerated.  Pt left in room with needs at hand, boyfriend present.  Time 2:  1415-1445 30 minutes  1:1 Pt c/o 7/10 pain in Lt LE, RN made aware.  Rec therapist present and PT/RT/pt discussed home set up, energy conservation and ideas for home.  Supine PROM to ankle and knee with brace on to pt tolerance with low load long duration stretch.  Pt passed off to PT for next session.   Jamisen Duerson 12/31/2017, 9:25 AM
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
## 2017-12-31 NOTE — Progress Notes (Signed)
Occupational Therapy Session Note  Patient Details  Name: [name] MRN: [id number] Date of Birth: 04/12/1991  Today's Date: 12/31/2017 OT Individual Time: 0930-1015 OT Individual Time Calculation (min): 45 min    Short Term Goals: Week 1:  OT Short Term Goal 1 (Week 1): STG=LTG 2/2 ELOS  Skilled Therapeutic Interventions/Progress Updates:    Pt seen this session for ADL training with a focus on activity tolerance and functional mobility. Pt received in w/c and worked on stand pivot transfers with RW with close S to steadying A to bed to sit on EOB for B/D. Pt able to reach R foot to wash it and then used reacher to don pants over R foot.  She needs considerable A to don pants over L foot due to bulky KI and A to doff down hips due to bulk.  Pt transferred back to w/c to complete grooming at sink. Discussed home set up.  Her w/c width would be 24 inches. She will have her mother measure bathroom doorways. Despite pain, pt tolerated therapy well today with excellent participation.  Pt in room with all needs met.  Therapy Documentation Precautions:  Precautions Precautions: Fall Required Braces or Orthoses: Knee Immobilizer - Left Knee Immobilizer - Left: On at all times Restrictions Weight Bearing Restrictions: Yes RLE Weight Bearing: Weight bearing as tolerated LLE Weight Bearing: Non weight bearing Other Position/Activity Restrictions: transfers only    Pain: Pain Assessment Pain Assessment: 0-10 Pain Score: 7  Faces Pain Scale: Hurts a little bit Pain Type: Surgical pain Pain Location: Leg Pain Orientation: Left Pain Descriptors / Indicators: Aching Pain Intervention(s): Medication (See eMAR) ADL: ADL ADL Comments: Please see functional navigator     See Function Navigator for Current Functional Status.   Therapy/Group: Individual Therapy  [place] 12/31/2017, 1:12 PM

## 2018-01-01 ENCOUNTER — Inpatient Hospital Stay (HOSPITAL_COMMUNITY): Payer: Self-pay | Admitting: Physical Therapy

## 2018-01-01 ENCOUNTER — Inpatient Hospital Stay (HOSPITAL_COMMUNITY): Payer: Self-pay | Admitting: Occupational Therapy

## 2018-01-01 DIAGNOSIS — S069X1D Unspecified intracranial injury with loss of consciousness of 30 minutes or less, subsequent encounter: Secondary | ICD-10-CM

## 2018-01-01 LAB — CBC
HEMATOCRIT: 29.9 % — AB (ref 36.0–46.0)
HEMOGLOBIN: 9.7 g/dL — AB (ref 12.0–15.0)
MCH: 31.4 pg (ref 26.0–34.0)
MCHC: 32.4 g/dL (ref 30.0–36.0)
MCV: 96.8 fL (ref 78.0–100.0)
Platelets: 558 10*3/uL — ABNORMAL HIGH (ref 150–400)
RBC: 3.09 MIL/uL — AB (ref 3.87–5.11)
RDW: 16.1 % — ABNORMAL HIGH (ref 11.5–15.5)
WBC: 5.7 10*3/uL (ref 4.0–10.5)

## 2018-01-01 NOTE — Progress Notes (Signed)
Physical Therapy Note  Patient Details  Name: [name] MRN: [id number] Date of Birth: 05/24/1991 Today's Date: 01/01/2018    Time: 1345-1425 40 minutes  1:1 Pt with no c/o pain.  Pt performs bed mobility, BSC and w/c transfers all with RW with supervision.  community w/c mobility with rec therapist with pt education on and pt performing w/c on/off elevators, in community setting, community bathroom mobility, open/closing doors.  Pt states she feels "a lot better" about going home after community outing.   [name] 01/01/2018, 2:27 PM

## 2018-01-01 NOTE — Progress Notes (Signed)
Occupational Therapy Session Note  Patient Details  Name: [name] MRN: [id number] Date of Birth: 04/05/1991  Today's Date: 01/01/2018 OT Individual Time: 1500-1530 OT Individual Time Calculation (min): 30 min    Short Term Goals: Week 1:  OT Short Term Goal 1 (Week 1): STG=LTG 2/2 ELOS  Skilled Therapeutic Interventions/Progress Updates:    Pt presenting supine in bed, agreeable to OT tx session. Pt reports LLE being "sore", though reports not yet time for pain meds, managed during session with rest and elevation. Pt completes bed mobility using leg lifter with S, completes stand pivot transfers throughout session using RW with MinGuard. Pt transfers EOB>w/c, propels w/c mod I to therapy gym, completes transfer w/c>mat table. Focus on sitting tolerance in preparation for seated ADL tasks including sitting on BSC as well as UB strengthening. Pt reporting some discomfort though tolerates sitting on mat table majority of session, completing UB strengthening using 3lb dowel rod including bicep curls, forward/backward rows, and chest presses for 10-15 reps each. Pt with seated rest breaks throughout during which further discussion held re: d/c planning and prep for d/c home later this week. Pt enthusiastic about planned d/c. Pt returns to w/c and room in manner described above at end of session, left seated in w/c with family present and needs within reach.   Therapy Documentation Precautions:  Precautions Precautions: Fall Required Braces or Orthoses: Knee Immobilizer - Left Knee Immobilizer - Left: On at all times Restrictions Weight Bearing Restrictions: Yes RLE Weight Bearing: Weight bearing as tolerated LLE Weight Bearing: Non weight bearing Other Position/Activity Restrictions: transfers only     ADL: ADL ADL Comments: Please see functional navigator     See Function Navigator for Current Functional Status.   Therapy/Group: Individual Therapy  Orlando PennerBreanna L  Salik Grewell 01/01/2018, 4:28 PM

## 2018-01-01 NOTE — Progress Notes (Signed)
Occupational Therapy Session Note  Patient Details  Name: [name] MRN: [id number] Date of Birth: 02/10/1991  Today's Date: 01/01/2018 OT Individual Time: 7829-56211045-1155 OT Individual Time Calculation (min): 70 min    Short Term Goals: Week 1:  OT Short Term Goal 1 (Week 1): STG=LTG 2/2 ELOS  Skilled Therapeutic Interventions/Progress Updates:    Pt received in bed and discussed plan for session to try actual toilet transfers as this is what she wants to do at home.  Pt's grandmother present and participated in hands on training with transfers and education on how to assist with self care. Pt is able to direct her own care well and instruct her grandmother.  Pt and grandmother completed bed to chair, [facility] and back and then back to bed. Pt toileted and bathed perineal area and buttocks but could not tolerate sitting on seat for long. Pt's hip became very painful and she needed to get back to w/c seat.  From w/c bathed R leg and foot, donned shorts using reacher, stood with close S as she pulled pants over hips.  Pain began to increase intensely so pt transferred back to bed for UB b/d.   Pt will be returning to a regular bed at home. From supine flat bed pt worked on exercises of glute squeezes with long legs, R glute squeeze with bent knee, abdominal/ core work with rolling in bed and small crunches. theraband exercises for upper back.  Pt positioned with HOB elevated to prepare for lunch. Pt in room with grandmother.  Therapy Documentation Precautions:  Precautions Precautions: Fall Required Braces or Orthoses: Knee Immobilizer - Left Knee Immobilizer - Left: On at all times Restrictions Weight Bearing Restrictions: Yes RLE Weight Bearing: Weight bearing as tolerated LLE Weight Bearing: Non weight bearing Other Position/Activity Restrictions: transfers only  Pain: Pain Assessment Pain Assessment: 0-10 Pain Score: 7  Pain Type: Acute pain Pain Location: Leg Pain  Orientation: Left Pain Descriptors / Indicators: Aching;Discomfort Pain Frequency: Constant Pain Onset: On-going Pain Intervention(s): Medication (See eMAR) ADL: ADL ADL Comments: Please see functional navigator    See Function Navigator for Current Functional Status.   Therapy/Group: Individual Therapy  SAGUIER,JULIA 01/01/2018, 1:19 PM

## 2018-01-01 NOTE — Progress Notes (Signed)
Changed dressings to left leg.  All incisions well approximated with sutures.  No drainage or redness noted.  Placed foam dressings over incisions for protection from knee immobilizer.  Patient very fearful, says she doesn't do well with blood and needles.  Mom present at bedside and took photos for comparison once discharged.  Asking about when the sutures would be removed since the patient is being discharged Friday.  Will consult MD about suture removal.  Dani Gobbleeardon, Roan Miklos J, RN

## 2018-01-01 NOTE — Progress Notes (Signed)
Recreational Therapy Session Note  Patient Details  Name: [name] MRN: [id number] Date of Birth: 01/02/1991 Today's Date: 01/01/2018  Pain: no c/o Skilled Therapeutic Interventions/Progress Updates: Session focused on activity tolerance, w/c mobility, identification and negotiation of obstacles during community skills task, problem solving through public restroom access during co-treat with PT.  Pt propelled w/c using BUEs throughout the hospital accessing public elevators, negotiating crowded cafe, hallway & waiting area with supervision.  Education provided on safety concerns and how to modify tasks for safe completion.  Grandmother present and observing. Therapy/Group: Co-Treatment  Beulah Capobianco 01/01/2018, 2:48 PM

## 2018-01-01 NOTE — Plan of Care (Signed)
  Progressing Consults RH GENERAL PATIENT EDUCATION Description See Patient Education module for education specifics. 01/01/2018 1533 - Progressing by Brita Romp, RN Skin Care Protocol Initiated - if Braden Score 18 or less Description If consults are not indicated, leave blank or document N/A 01/01/2018 1533 - Progressing by Brita Romp, RN Nutrition Consult-if indicated 01/01/2018 1533 - Progressing by Brita Romp, RN RH SKIN INTEGRITY RH STG MAINTAIN SKIN INTEGRITY WITH ASSISTANCE Description STG Maintain Skin Integrity With mod Assistance.  01/01/2018 1533 - Progressing by Brita Romp, RN RH OTHER STG SKIN INTEGRITY GOALS W/ASSIST Description Other STG Skin Integrity Goals With mod Assistance.  01/01/2018 1533 - Progressing by Brita Romp, RN RH PAIN MANAGEMENT RH STG PAIN MANAGED AT OR BELOW PT'S PAIN GOAL Description Pain less than or equal to 2.  01/01/2018 1533 - Progressing by Brita Romp, RN RH OTHER STG PAIN MANAGEMENT GOALS W/ASSIST Description Other STG Pain Management Goals With min Assistance.  01/01/2018 1533 - Progressing by Brita Romp, RN   Completed/Met RH BOWEL ELIMINATION RH STG MANAGE BOWEL WITH ASSISTANCE Description STG Manage Bowel with min Assistance.  01/01/2018 1533 - Completed/Met by Brita Romp, RN RH STG MANAGE BOWEL W/MEDICATION W/ASSISTANCE Description STG Manage Bowel with Medication with min  Assistance.  01/01/2018 1533 - Completed/Met by Brita Romp, RN RH STG MANAGE BOWEL W/EQUIPMENT W/ASSISTANCE Description STG Manage Bowel With Equipment With mod Assistance  01/01/2018 1533 - Completed/Met by Brita Romp, RN RH OTHER STG BOWEL ELIMINATION GOALS W/ASSIST Description Other STG Bowel Elimination Goals With min Assistance.  01/01/2018 1533 - Completed/Met by Brita Romp, RN RH BLADDER ELIMINATION RH STG MANAGE BLADDER WITH ASSISTANCE Description STG Manage  Bladder With min Assistance  01/01/2018 1533 - Completed/Met by Brita Romp, RN RH STG MANAGE BLADDER WITH MEDICATION WITH ASSISTANCE Description STG Manage Bladder With Medication With min Assistance.  01/01/2018 1533 - Completed/Met by Brita Romp, RN RH STG MANAGE BLADDER WITH EQUIPMENT WITH ASSISTANCE Description STG Manage Bladder With Equipment With min Assistance  01/01/2018 1533 - Completed/Met by Brita Romp, RN RH OTHER STG BLADDER ELIMINATION GOALS W/ASSIST Description Other STG Bladder Elimination Goals With min Assistance  01/01/2018 1533 - Completed/Met by Brita Romp, RN RH SAFETY RH STG ADHERE TO SAFETY PRECAUTIONS W/ASSISTANCE/DEVICE Description STG Adhere to Safety Precautions With mod Assistance/Device.  01/01/2018 1533 - Completed/Met by Brita Romp, RN RH STG DECREASED RISK OF FALL WITH ASSISTANCE Description STG Decreased Risk of Fall With mod Assistance.  01/01/2018 1533 - Completed/Met by Brita Romp, RN RH STG DEMO UNDERSTANDING HOME SAFETY PRECAUTIONS 01/01/2018 1533 - Completed/Met by Brita Romp, RN RH OTHER STG SAFETY GOALS W/ASSIST Description Other STG Safety Goals With min Assistance.  01/01/2018 1533 - Completed/Met by Brita Romp, RN RH KNOWLEDGE DEFICIT GENERAL RH STG INCREASE KNOWLEDGE OF SELF CARE AFTER HOSPITALIZATION 01/01/2018 1533 - Completed/Met by Brita Romp, RN
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
## 2018-01-01 NOTE — Progress Notes (Signed)
Physical Therapy Note  Patient Details  Name: Konrad DoloresDarian Mangel MRN: [id number] Date of Birth: 03/31/1991 Today's Date: 01/01/2018    Time: [phone] 60 minutes  1:1 pt c/o 6/10 pain in Lt LE, meds given prior to session.  Pt able to perform bed mobility and transfers with set up assist only and use of LE lifter this session.  Bed mobility in ADL apartment to simulate home with pt able to perform with LE lifter and increased time.  W/c mobility and parts management with pt requiring assist for leg rest management but improving independence with set up for transfers.  nustep for UE and Rt LE strength and endurance x 8 minutes level 4.  Pt's grandmother checked off after demonstrating w/c leg rest management and providing set up to pt for transfers to w/c and BSC.   Sheralyn Pinegar 01/01/2018, 10:30 AM

## 2018-01-02 ENCOUNTER — Inpatient Hospital Stay (HOSPITAL_COMMUNITY): Payer: Self-pay

## 2018-01-02 ENCOUNTER — Inpatient Hospital Stay (HOSPITAL_COMMUNITY): Payer: Self-pay | Admitting: Occupational Therapy

## 2018-01-02 MED ORDER — OXYCODONE HCL 5 MG PO TABS
5.0000 mg | ORAL_TABLET | Freq: Four times a day (QID) | ORAL | Status: DC | PRN
  Administered 2018-01-02 – 2018-01-03 (×3): 5 mg via ORAL
  Filled 2018-01-02 (×3): qty 1

## 2018-01-02 MED ORDER — TRAMADOL HCL 50 MG PO TABS: 50.0000 mg | ORAL_TABLET | Freq: Four times a day (QID) | ORAL | Status: DC | PRN

## 2018-01-02 MED ORDER — ACETAMINOPHEN 325 MG PO TABS
650.0000 mg | ORAL_TABLET | Freq: Three times a day (TID) | ORAL | Status: DC
  Administered 2018-01-02 – 2018-01-03 (×5): 650 mg via ORAL
  Filled 2018-01-02 (×5): qty 2

## 2018-01-02 MED ORDER — ACETAMINOPHEN 325 MG PO TABS: 650.0000 mg | ORAL_TABLET | ORAL | Status: DC | PRN

## 2018-01-02 MED ORDER — ADULT MULTIVITAMIN W/MINERALS CH
1.0000 | ORAL_TABLET | Freq: Every day | ORAL | Status: DC
  Administered 2018-01-02 – 2018-01-03 (×2): 1 via ORAL
  Filled 2018-01-02 (×2): qty 1

## 2018-01-02 MED ORDER — BETHANECHOL CHLORIDE 25 MG PO TABS
25.0000 mg | ORAL_TABLET | Freq: Two times a day (BID) | ORAL | Status: DC
  Administered 2018-01-02 – 2018-01-03 (×2): 25 mg via ORAL
  Filled 2018-01-02 (×2): qty 1

## 2018-01-02 MED ORDER — TRAMADOL HCL 50 MG PO TABS
50.0000 mg | ORAL_TABLET | Freq: Four times a day (QID) | ORAL | Status: DC | PRN
  Administered 2018-01-02 – 2018-01-03 (×4): 100 mg via ORAL
  Filled 2018-01-02 (×5): qty 2

## 2018-01-02 MED ORDER — OXYCODONE HCL 5 MG PO TABS: 5.0000 mg | ORAL_TABLET | Freq: Four times a day (QID) | ORAL | Status: DC | PRN

## 2018-01-02 NOTE — Plan of Care (Signed)
  Completed/Met Consults [facility] GENERAL PATIENT EDUCATION Description See Patient Education module for education specifics. 01/02/2018 1525 - Completed/Met by Brita Romp, RN Skin Care Protocol Initiated - if Braden Score 18 or less Description If consults are not indicated, leave blank or document N/A 01/02/2018 1525 - Completed/Met by Brita Romp, RN Nutrition Consult-if indicated 01/02/2018 1525 - Completed/Met by Brita Romp, RN RH SKIN INTEGRITY RH STG MAINTAIN SKIN INTEGRITY WITH ASSISTANCE Description STG Maintain Skin Integrity With mod Assistance.  01/02/2018 1525 - Completed/Met by Brita Romp, RN RH OTHER STG SKIN INTEGRITY GOALS W/ASSIST Description Other STG Skin Integrity Goals With mod Assistance.  01/02/2018 1525 - Completed/Met by Brita Romp, RN RH PAIN MANAGEMENT RH STG PAIN MANAGED AT OR BELOW PT'S PAIN GOAL Description Pain less than or equal to 2.  01/02/2018 1525 - Completed/Met by Brita Romp, RN RH OTHER STG PAIN MANAGEMENT GOALS W/ASSIST Description Other STG Pain Management Goals With min Assistance.  01/02/2018 1525 - Completed/Met by Brita Romp, RN

## 2018-01-02 NOTE — Progress Notes (Signed)
Occupational Therapy Discharge Summary  Patient Details  Name: [name] MRN: [id number] Date of Birth: Oct 16, 1991    Patient has met 8 of 8 long term goals due to improved activity tolerance, improved balance, postural control and ability to compensate for deficits. Additional goal of tub/shower transfer is not applicable at this time as pt is not yet medically cleared to shower and plans to sponge bathe initially after return home. Patient to discharge at overall Modified Independent level.  Patient's care partner is independent to provide the necessary physical assistance at discharge.    Reasons goals not met: N/A  Recommendation:  Patient will benefit from ongoing skilled OT services in home health setting to continue to advance functional skills in the area of BADL and Reduce care partner burden.  Equipment: BSC  Reasons for discharge: treatment goals met  Patient/family agrees with progress made and goals achieved: Yes  OT Discharge Precautions/Restrictions  Restrictions Weight Bearing Restrictions: Yes RLE Weight Bearing: Weight bearing as tolerated LLE Weight Bearing: Non weight bearing    Pain Pain Assessment Pain Assessment: 0-10 Pain Score: 7  Pain Type: Acute pain Pain Location: Leg Pain Orientation: Left Pain Descriptors / Indicators: Sore Pain Frequency: Constant Pain Onset: On-going Pain Intervention(s): Medication (See eMAR) ADL ADL ADL Comments: Please see functional navigator     Cognition Orientation Level: Oriented X4 Sensation Sensation Light Touch: Appears Intact Proprioception: Appears Intact Coordination Gross Motor Movements are Fluid and Coordinated: No(LLE) Fine Motor Movements are Fluid and Coordinated: No(LLE)    Mobility  Bed Mobility Bed Mobility: Supine to Sit;Sit to Supine Supine to Sit: 5: Supervision Sit to Supine: 5: Supervision Transfers Transfers: Sit to Stand Sit to Stand: 5: Supervision;From bed;From  chair/3-in-1 Stand to Sit: 5: Supervision;To bed;To chair/3-in-1  Trunk/Postural Assessment  Cervical Assessment Cervical Assessment: Within Functional Limits Thoracic Assessment Thoracic Assessment: Within Functional Limits Lumbar Assessment Lumbar Assessment: Exceptions to WFL(minor limitations due to continued pain/soreness ) Postural Control Postural Control: Within Functional Limits  Balance Balance Balance Assessed: Yes Static Sitting Balance Static Sitting - Balance Support: No upper extremity supported Static Sitting - Level of Assistance: 6: Modified independent (Device/Increase time) Dynamic Sitting Balance Sitting balance - Comments: Pt able to reach outside BOS without LOB  Static Standing Balance Static Standing - Balance Support: During functional activity Static Standing - Level of Assistance: 6: Modified independent (Device/Increase time) Dynamic Standing Balance Dynamic Standing - Balance Support: During functional activity;Left upper extremity supported;Right upper extremity supported Dynamic Standing - Level of Assistance: 6: Modified independent (Device/Increase time) Extremity/Trunk Assessment RUE Assessment RUE Assessment: Within Functional Limits LUE Assessment LUE Assessment: Within Functional Limits   See Function Navigator for Current Functional Status.  Raymondo Band 01/02/2018, 3:43 PM

## 2018-01-02 NOTE — Progress Notes (Addendum)
Physical Therapy Note  Patient Details  Name: Michele DoloresDarian Robertson MRN: [id number] Date of Birth: 05/07/1991 Today's Date: 01/02/2018  tx 1:  0915-1025, 70 min individual tx 6/10 L knee , premedicated   Supine neuro re-ed LLE via demo, multimodal cues, visual feedback for:  active assistive L heel slides, straight leg raises, hip abd/adduction.;active L quad sets and toe ext and ankle DF.  Attempted prone positioning per Ortho orders for HEP, but pt unable to tolerate due to pelvic pain.   Supervision stand pivot transfer with RW.  Simulated car transfer as though sitting in back seat, with LLE positioned up on seat. W/c propulsion using bil UEs over level tile, in home setting and open hallways.    Pt opened door from w/c level, pushing backwards to open it.  Pt left resting in w/c with all needs within reach.  tx 2: 1300-1400, 60 min individual tx Pain: 6/10 L knee, premedicated  Bed> w/c using RW, leg lifter, modified independent.  W/c propulsion on unit, modified independent.  LLE neuro re-ed for ankle DF and knee flexion, seated EOM, using Maxi slide , visual feedback and multimodal cues to facilitate L heel slide/knee flexion. PROM knee flex = 38 degrees sitting, and ankle DF -40 degrees sitting, foot supported.  Pt returned to bed at end of session, awaiting Nsg to remove stitches.  Pt very anxious about stitch removal; PT provided emotional support.  Alarm set and all needs left at hand.  See function navigator for current status.   Caryn Gienger 01/02/2018, 9:40 AM

## 2018-01-02 NOTE — Progress Notes (Signed)
Physical Therapy Discharge Summary  Patient Details  Name: [name] MRN: [id number] Date of Birth: 09/09/1991  Patient has met 7 of 7 long term goals due to improved activity tolerance, improved balance, increased strength, decreased pain, ability to compensate for deficits and functional use of  right upper extremity, right lower extremity, left upper extremity and left lower extremity.  Patient to discharge at a wheelchair level Modified Independent.   Patient's care partner is independent to provide the necessary physical assistance (to access home)at discharge.  Reasons goals not met: n/a  Recommendation:  Patient will benefit from ongoing skilled PT services in home health setting to continue to advance safe functional mobility, address ongoing impairments in strength, flexibility, pain, mobility and locomotion, and minimize fall risk.  Equipment: RW, manual w/c with ELRs  Reasons for discharge: treatment goals met and discharge from hospital  Patient/family agrees with progress made and goals achieved: Yes  PT Discharge Precautions/Restrictions Precautions Precautions: Fall Required Braces or Orthoses: Knee Immobilizer - Left(unlocked) Knee Immobilizer - Left: On at all times Restrictions Weight Bearing Restrictions: Yes RLE Weight Bearing: Weight bearing as tolerated LLE Weight Bearing: Non weight bearing Other Position/Activity Restrictions: transfers only   Pain Pain Assessment Pain Assessment: 0-10 Pain Score: 6  Pain Type: Acute pain Pain Location: Knee Pain Orientation: Left Pain Descriptors / Indicators: Sore Pain Frequency: Constant Pain Onset: On-going Pain Intervention(s): Medication (See eMAR) Vision/Perception no glasses; at baseline    Cognition Overall Cognitive Status: Within Functional Limits for tasks assessed Arousal/Alertness: Awake/alert Orientation Level: Oriented X4 Memory: Appears intact Awareness: Appears intact Problem Solving:  Appears intact Sensation Sensation Light Touch: Appears Intact Proprioception: Appears Intact Coordination Gross Motor Movements are Fluid and Coordinated: No(LLE) Fine Motor Movements are Fluid and Coordinated: No(LLE) Heel Shin Test: NT Motor generalized weakness LLE and core due to pelvic fx    Mobility Bed Mobility Bed Mobility: Rolling Right;Rolling Left Rolling Right: 6: Modified independent (Device/Increase time) Rolling Left: 6: Modified independent (Device/Increase time) Supine to Sit: 6: Modified independent (Device/Increase time) Sit to Supine: 6: Modified independent (Device/Increase time)(leg lifter) Transfers Transfers: Yes Sit to Stand: 6: Modified independent (Device/Increase time) Stand to Sit: 6: Modified independent (Device/Increase time) Stand Pivot Transfers: 6: Modified independent (Device/Increase time) Stand Pivot Transfer Details (indicate cue type and reason): L Bledsoe brace, RW Locomotion  Ambulation Ambulation: No Gait Gait: No Stairs / Additional Locomotion Stairs: Yes Stairs Assistance: 1: +1 Total assist Stairs Assistance Details (indicate cue type and reason): family to bump pt up 1-2 steps with pt in w/c Wheelchair Mobility Wheelchair Mobility: Yes Wheelchair Assistance: 6: Modified independent (Device/Increase time) Environmental health practitioner: Both upper extremities Wheelchair Parts Management: Needs assistance(manages R leg rest; needs assistance with L leg rest) Distance: 200  Trunk/Postural Assessment  Cervical Assessment Cervical Assessment: Within Functional Limits Thoracic Assessment Thoracic Assessment: Within Functional Limits Lumbar Assessment Lumbar Assessment: Exceptions to WFL(reduced wt bearing L hip due to pain) Postural Control Postural Control: Within Functional Limits  Balance Balance Balance Assessed: Yes Static Sitting Balance Static Sitting - Balance Support: No upper extremity supported Static Sitting - Level of  Assistance: 6: Modified independent (Device/Increase time) Dynamic Sitting Balance Dynamic Sitting - Level of Assistance: 6: Modified independent (Device/Increase time) Sitting balance - Comments: Pt able to reach outside BOS without LOB  Static Standing Balance Static Standing - Balance Support: During functional activity Static Standing - Level of Assistance: 6: Modified independent (Device/Increase time) Dynamic Standing Balance Dynamic Standing - Balance Support: During functional activity;Left  upper extremity supported;Right upper extremity supported Dynamic Standing - Level of Assistance: 6: Modified independent (Device/Increase time) Dynamic Standing - Comments: for stand pivot transfer with Rw Extremity Assessment  RUE Assessment RUE Assessment: Within Functional Limits LUE Assessment LUE Assessment: Within Functional Limits RLE Assessment RLE Assessment: Within Functional Limits RLE PROM (degrees) Overall PROM Right Lower Extremity: Within functional limits for tasks assessed RLE Strength RLE Overall Strength: Within Functional Limits for tasks assessed RLE Overall Strength Comments: funcitonal for stand pivot transfer LLE Assessment LLE Assessment: Exceptions to [facility] LLE PROM (degrees) LLE Overall PROM Comments: in seated position: knee flexion  = 38 degreees, ankle DF=  -40 degrees LLE Strength LLE Overall Strength: Deficits LLE Overall Strength Comments: grossly in supine: 2-/5 hip flex, knee flex, knee ext, toe ext; 1+5 ankle DF LLE Tone LLE Tone: Within Functional Limits   See Function Navigator for Current Functional Status.  [name] 01/02/2018, 4:12 PM
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
## 2018-01-02 NOTE — Progress Notes (Signed)
Recreational Therapy Discharge Summary Patient Details  Name: [name] MRN: [id number] Date of Birth: 10-Sep-1991 Today's Date: 01/02/2018 Comments on progress toward goals: Pt has made good progress during LOS and is ready for discharge home with family to provide supervision assistance.  TR session focused on activity analysis with potential modifications, community reintegration, identification and negotiation of obstacles, accessing public restroom and transporting needed items safely w/c level.  Pt participated in community skills task with supervision w/c level.  Goals met.   Reasons for discharge: discharge from hospital    Patient/family agrees with progress made and goals achieved: Yes  Jaimya Feliciano 01/02/2018, 11:50 AM

## 2018-01-02 NOTE — Progress Notes (Signed)
Occupational Therapy Session Note  Patient Details  Name: [name] MRN: [id number] Date of Birth: 02/14/1991  Today's Date: 01/02/2018 OT Individual Time: 1100-1155 OT Individual Time Calculation (min): 55 min    Short Term Goals: Week 1:  OT Short Term Goal 1 (Week 1): STG=LTG 2/2 ELOS  Skilled Therapeutic Interventions/Progress Updates:    Pt presents supine in bed agreeable to OT tx session. Pt completing bathing/dressing ADLs seated EOB with setup assist and S during sit<>stand at RW and standing portions during task completion. Pt dons UB clothing with setup assist, LB clothing with light MinA and MinGuard while standing at RW to advance over hips. Pt completes stand pivot EOB<>BSC with RW and completes toileting with S throughout. Pt returns to supine in bed using leg lifter with S; engages in bil UE ther ex using level 2 theraband across all UE planes for 15-20 reps each for increased UB strengthening. Pt left supine in bed end of session, call bell and needs within reach.   Therapy Documentation Precautions:  Precautions Precautions: Fall Required Braces or Orthoses: Knee Immobilizer - Left Knee Immobilizer - Left: On at all times Restrictions Weight Bearing Restrictions: Yes RLE Weight Bearing: Weight bearing as tolerated LLE Weight Bearing: Non weight bearing Other Position/Activity Restrictions: transfers only    Pain: Pain Assessment Pain Assessment: Faces Pain Score: 5  Faces Pain Scale: Hurts little more Pain Type: Acute pain Pain Location: Leg Pain Orientation: Left Pain Descriptors / Indicators: Sore Pain Frequency: Constant Pain Onset: On-going Pain Intervention(s): Rest;Repositioned Multiple Pain Sites: No ADL: ADL ADL Comments: Please see functional navigator     See Function Navigator for Current Functional Status.   Therapy/Group: Individual Therapy  Orlando PennerBreanna L Dak Szumski 01/02/2018, 12:48 PM
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
## 2018-01-02 NOTE — Progress Notes (Signed)
Wounds LLE healing well.
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
## 2018-01-02 NOTE — Progress Notes (Signed)
Social Work Patient ID: [name], female   DOB: 12-May-1991, 27 y.o.   MRN: [id number]   CSW met with pt and her mother to update them on team conference discussion and targeted d/c date of 01-03-18.  They were pleased with this and feel prepared for d/c.  Pt's mother is a Marine scientist and she or her mother will be with pt at home.  Mother has already rearranged things at home for pt and she is very supportive.  CSW will arrange [facility] and DME for pt as she does not have insurance.  CSW will also provide pt with [facility] for medication.  CSW gave pt information on [facility], as well.  CSW will continue to follow pt and assist with any other needs.

## 2018-01-03 ENCOUNTER — Other Ambulatory Visit: Payer: Self-pay

## 2018-01-03 ENCOUNTER — Encounter (HOSPITAL_COMMUNITY): Payer: Self-pay

## 2018-01-03 LAB — COMPREHENSIVE METABOLIC PANEL
ALT: 21 U/L (ref 14–54)
ANION GAP: 10 (ref 5–15)
AST: 21 U/L (ref 15–41)
Albumin: 2.7 g/dL — ABNORMAL LOW (ref 3.5–5.0)
Alkaline Phosphatase: 85 U/L (ref 38–126)
BILIRUBIN TOTAL: 0.7 mg/dL (ref 0.3–1.2)
BUN: 18 mg/dL (ref 6–20)
CHLORIDE: 104 mmol/L (ref 101–111)
CO2: 26 mmol/L (ref 22–32)
Calcium: 9 mg/dL (ref 8.9–10.3)
Creatinine, Ser: 0.44 mg/dL (ref 0.44–1.00)
Glucose, Bld: 95 mg/dL (ref 65–99)
POTASSIUM: 4.1 mmol/L (ref 3.5–5.1)
Sodium: 140 mmol/L (ref 135–145)
TOTAL PROTEIN: 5.7 g/dL — AB (ref 6.5–8.1)

## 2018-01-03 MED ORDER — OXYCODONE HCL 5 MG PO TABS: 5.0000 mg | ORAL_TABLET | Freq: Two times a day (BID) | ORAL | 0 refills | Status: DC | PRN

## 2018-01-03 MED ORDER — PANTOPRAZOLE SODIUM 20 MG PO TBEC: 20.0000 mg | DELAYED_RELEASE_TABLET | Freq: Every day | ORAL | 0 refills | Status: DC

## 2018-01-03 MED ORDER — TRAMADOL HCL 50 MG PO TABS: 50.0000 mg | ORAL_TABLET | Freq: Four times a day (QID) | ORAL | 0 refills | Status: DC | PRN

## 2018-01-03 MED ORDER — FERROUS GLUCONATE 324 (38 FE) MG PO TABS: 324.0000 mg | ORAL_TABLET | Freq: Two times a day (BID) | ORAL | 0 refills | Status: DC

## 2018-01-03 MED ORDER — METHOCARBAMOL 500 MG PO TABS: 500.0000 mg | ORAL_TABLET | Freq: Four times a day (QID) | ORAL | 0 refills | Status: DC | PRN

## 2018-01-03 MED ORDER — BETHANECHOL CHLORIDE 25 MG PO TABS: 25.0000 mg | ORAL_TABLET | Freq: Two times a day (BID) | ORAL | 0 refills | Status: DC

## 2018-01-03 MED ORDER — ADULT MULTIVITAMIN W/MINERALS CH: 1.0000 | ORAL_TABLET | Freq: Every day | ORAL | Status: AC

## 2018-01-03 MED ORDER — TAMSULOSIN HCL 0.4 MG PO CAPS: 0.4000 mg | ORAL_CAPSULE | Freq: Every day | ORAL | 0 refills | Status: DC

## 2018-01-03 MED ORDER — ENOXAPARIN SODIUM 40 MG/0.4ML ~~LOC~~ SOLN: 40.0000 mg | SUBCUTANEOUS | 0 refills | Status: DC

## 2018-01-03 MED ORDER — POLYETHYLENE GLYCOL 3350 17 G PO PACK: 17.0000 g | PACK | Freq: Two times a day (BID) | ORAL | 0 refills | Status: DC

## 2018-01-03 MED ORDER — ACETAMINOPHEN 325 MG PO TABS: 650.0000 mg | ORAL_TABLET | Freq: Three times a day (TID) | ORAL | Status: DC

## 2018-01-03 NOTE — Discharge Instructions (Signed)
Inpatient Rehab Discharge Instructions  [name] Discharge date and time: 01/03/18   Activities/Precautions/ Functional Status: Activity: activity as tolerated. No driving. NO weight on left leg. No walking--can stand on right leg for transfers only.  Diet: regular diet Wound Care: keep wound clean and dry Contact MD if you develop any problems with your incision/wound--redness, swelling, increase in pain, drainage or if you develop fever or chills.    Functional status:  ___ No restrictions     ___ Walk up steps independently _X__ 24/7 supervision/assistance   ___ Walk up steps with assistance ___ Intermittent supervision/assistance  ___ Bathe/dress independently ___ Walk with walker     _X__ Bathe/dress with assistance ___ Walk Independently    ___ Shower independently ___ Walk with assistance    ___ Shower with assistance _X__ No alcohol     ___ Return to work/school ________   Special Instructions: 1. Need to decrease narcotic use every 5 days to off.  2. Use ice or heat to incisions on and off after therapy/activity for pain control.  3. Adjust laxatives as needed to avoid constipation.   Opioid Overdose Opioids are substances that relieve pain by binding to pain receptors in your brain and spinal cord. Opioids include illegal drugs, such as heroin, as well as prescription pain medicines.An opioid overdose happens when you take too much of an opioid substance. This can happen with any type of opioid, including:  Heroin.  Morphine.  Codeine.  Methadone.  Oxycodone.  Hydrocodone.  Fentanyl.  Hydromorphone.  Buprenorphine.  The effects of an overdose can be mild, dangerous, or even deadly. Opioid overdose is a medical emergency. What are the causes? This condition may be caused by:  Taking too much of an opioid by accident.  Taking too much of an opioid on purpose.  An error made by a health care provider who prescribes a medicine.  An error made by  the pharmacist who fills the prescription order.  Using more than one substance that contains opioids at the same time.  Mixing an opioid with a substance that affects your heart, breathing, or blood pressure. These include alcohol, tranquilizers, sleeping pills, illegal drugs, and some over-the-counter medicines.  What increases the risk? This condition is more likely in:  Children. They may be attracted to colorful pills. Because of a child's small size, even a small amount of a drug can be dangerous.  Elderly people. They may be taking many different drugs. Elderly people may have difficulty reading labels or remembering when they last took their medicine.  People who take an opioid on a long-term basis.  People who use: ? Illegal drugs. ? Other substances, including alcohol, while using an opioid.  People who have: ? A history of drug or alcohol abuse. ? Certain mental health conditions.  People who take opioids that are not prescribed for them.  What are the signs or symptoms? Symptoms of this condition depend on the type of opioid and the amount that was taken. Common symptoms include:  Sleepiness or difficulty waking from sleep.  Confusion.  Slurred speech.  Slowed breathing and a slow pulse.  Nausea and vomiting.  Abnormally small pupils.  Signs and symptoms that require emergency treatment include:  Cold, clammy, and pale skin.  Blue lips and fingernails.  Vomiting.  Gurgling sounds in the throat.  A pulse that is very slow or difficult to detect.  Breathing that is very slow, noisy, or difficult to detect.  Limp body.  Inability to  respond to speech or be awakened from sleep (stupor).  How is this diagnosed? This condition is diagnosed based on your symptoms. It is important to tell your health care provider:  All of the opioidsthat you took.  When you took the opioids.  Whether you were drinking alcohol or using other substances.  Your  health care provider will do a physical exam. This exam may include:  Checking and monitoring your heart rate and rhythm, your breathing rate and depth, your temperature, and your blood pressure (vital signs).  Checking for abnormally small pupils.  Measuring oxygen levels in your blood.  You may also have blood tests or urine tests. How is this treated? Supporting your vital signs and your breathing is the first step in treating an opioid overdose. Treatment may also include:  Giving fluids and minerals (electrolytes) through an IV tube.  Inserting a breathing tube (endotracheal tube) in your airway to help you breathe.  Giving oxygen.  Passing a tube through your nose and into your stomach (NG tube, or nasogastric tube) to wash out your stomach.  Giving medicines that: ? Increase your blood pressure. ? Absorb any opioid that is in your digestive system. ? Reverse the effects of the opioid (naloxone).  Ongoing counseling and mental health support if you intentionally overdosed or used an illegal drug.  Follow these instructions at home:  Take over-the-counter and prescription medicines only as told by your health care provider. Always ask your health care provider about possible side effects and interactions of any new medicine that you start taking.  Keep a list of all of the medicines that you take, including over-the-counter medicines. Bring this list with you to all of your medical visits.  Drink enough fluid to keep your urine clear or pale yellow.  Keep all follow-up visits as told by your health care provider. This is important. How is this prevented?  Get help if you are struggling with: ? Alcohol or drug use. ? Depression or another mental health problem.  Keep the phone number of your local poison control center near your phone or on your cell phone.  Store all medicines in safety containers that are out of the reach of children.  Read the drug inserts that  come with your medicines.  Do not drink alcohol when taking opioids.  Do not use illegal drugs.  Do not take opioid medicines that are not prescribed for you. Contact a health care provider if:  Your symptoms return.  You develop new symptoms or side effects when you are taking medicines. Get help right away if:  You think that you or someone else may have taken too much of an opioid. The hotline of the [facility] is (779) 356-2573.  You or someone else is having symptoms of an opioid overdose.  You have serious thoughts about hurting yourself or others.  You have: ? Chest pain. ? Difficulty breathing. ? A loss of consciousness. Opioid overdose is an emergency. Do not wait to see if the symptoms will go away. Get medical help right away. Call your local emergency services (911 in the [place] Do not drive yourself to the hospital. This information is not intended to replace advice given to you by your health care provider. Make sure you discuss any questions you have with your health care provider. Document Released: 01/03/2005 Document Revised: 05/03/2016 Document Reviewed: 05/12/2015 Elsevier Interactive Patient Education  2018 [employer]    COMMUNITY REFERRALS UPON DISCHARGE:  Home Health:   PT     OT     RN  Agency:  [facility] Phone:  [phone] Medical Equipment/Items Ordered:  16"x16" lightweight wheelchair with elevating leg rests and basic back and seat cushions; rolling walker; and bedside commode  Agency/Supplier:  [facility]        Phone:  [phone] [facility]:  Refer to the packet Boneta Lucks gave you with information on how to become a patient. MATCH:  One time only medication assistance.  Take your prescriptions to one of the pharmacies on the list Boneta Lucks gave you with the card.  Medications will be $3 each.     My questions have been answered and I understand these instructions. I will adhere to these  goals and the provided educational materials after my discharge from the hospital.  Patient/Caregiver Signature _______________________________ Date __________  Clinician Signature _______________________________________ Date __________  Please bring this form and your medication list with you to all your follow-up doctor's appointments.

## 2018-01-03 NOTE — Progress Notes (Signed)
Patient discharged home.  Left floor via wheelchair, escorted by nursing staff and family.  Patient and family verbalized understanding of discharge instructions as given by Marissa NestlePam Love, PA.  All patient belongings sent with patient, including DME and prescriptions.  Patient appears to be in no immediate distress at this time.  Dani Gobbleeardon, Rayann Jolley J, RN
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
## 2018-01-06 ENCOUNTER — Telehealth: Payer: Self-pay | Admitting: Registered Nurse

## 2018-01-06 NOTE — Telephone Encounter (Signed)
1st Attempt Call  For Transitional Care Call : Spoke to Ms. [name] ( Mother), she wanted appointment changed due to work hours. Appointment can be changed to 01/17/2018, she needs to be here by 8:45. Mother asked if I could call back. I called no answer, left message to return the call.

## 2018-01-06 NOTE — Telephone Encounter (Signed)
Transitional Care call  Transitional Care Call Completed, Appointment Confirmed, Address Confirmed, New Patient Packet Sent.   Transitional Care Call Questions Answered by [name] ( Mother)  Patient name: Michele DoloresDarian Robertson DOB: 1991/11/24 1. Are you/is patient experiencing any problems since coming home? No, Reports left leg pain.  a. Are there any questions regarding any aspect of care? No 2. Are there any questions regarding medications administration/dosing? No a. Are meds being taken as prescribed? Yes b. "Patient should review meds with caller to confirm" Medication List Reviewed 3. Have there been any falls? No 4. Has Home Health been to the house and/or have they contacted you? Yes. Advance Care Nurse has come out/ Physical Therapist scheduled to come out 01/06/2018. a. If not, have you tried to contact them? See Above b. Can we help you contact them? NA 5. Are bowels and bladder emptying properly? Yes a. Are there any unexpected incontinence issues? No b. If applicable, is patient following bowel/bladder programs? NA 6. Any fevers, problems with breathing, unexpected pain? No 7. Are there any skin problems or new areas of breakdown? No 8. Has the patient/family member arranged specialty MD follow up (ie cardiology/neurology/renal/surgical/etc.)?  Yes: Has an appointment with Dr. Carola FrostHandy on 01/08/2018 a. Can we help arrange? NA 9. Does the patient need any other services or support that we can help arrange? No 10. Are caregivers following through as expected in assisting the patient? Yes 11. Has the patient quit smoking, drinking alcohol, or using drugs as recommended? Ms. Randa Evensdwards not smoking, or drinking alcohol or using iliicit Drugs.  Appointment date/time 01/17/2018, arrive time 08:45 Appointment Time 09:00 am with Dr. Allena KatzPatel. [address]

## 2018-01-07 ENCOUNTER — Telehealth: Payer: Self-pay | Admitting: *Deleted

## 2018-01-07 DIAGNOSIS — T1490XA Injury, unspecified, initial encounter: Secondary | ICD-10-CM

## 2018-01-07 NOTE — Progress Notes (Signed)
Social Work Discharge Note  The overall goal for the admission was met for:   Discharge location: Yes - home with mother and grandmother  Length of Stay: Yes - 7 days  Discharge activity level: Yes - OT minimal assistance; PT modified independent w/c level  Home/community participation: Yes  Services provided included: MD, RD, PT, OT, RN, Pharmacy, Neuropsych and SW  Financial Services: Other: self pay  Follow-up services arranged: Home Health: PT/OT/RN from [facility], DME: 16'x16" lightweight w/c with basic back and seat cushions; rolling walker; 3-n-1 commode from [facility] and Patient/Family has no preference for HH/DME agencies  Comments (or additional information):  Pt's mother is a Marine scientist and came for family education.  She feels prepared to care for pt at home and pt feels prepared to go home.  CSW gave her information on the [facility].    Patient/Family verbalized understanding of follow-up arrangements: Yes  Individual responsible for coordination of the follow-up plan: Pt with mother and grandmother to assist as needed.  Confirmed correct DME delivered: Trey Sailors 01/07/2018    Iyonnah Ferrante, Silvestre Mesi
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
## 2018-01-07 NOTE — Telephone Encounter (Signed)
Ms [name] [facility]. Marisue IvanLiz RN [facility] is asking for 1wk3, 1qowk7.  Approval given.

## 2018-01-07 NOTE — Telephone Encounter (Signed)
Marisue IvanLiz, RN, [facility] left a message asking for verbal orders.  I called the number back and left a message asking for return call for more details.

## 2018-01-08 NOTE — Telephone Encounter (Signed)
Does she have an appointment to see Neuropsych?  If not, can we try to get her in.  We can give her Klonopin .25mg  BID PRN until her appointment.  Thanks.
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
## 2018-01-10 ENCOUNTER — Ambulatory Visit (HOSPITAL_COMMUNITY): Payer: No Typology Code available for payment source | Admitting: Anesthesiology

## 2018-01-10 ENCOUNTER — Ambulatory Visit (HOSPITAL_COMMUNITY)
Admission: RE | Admit: 2018-01-10 | Discharge: 2018-01-10 | Disposition: A | Discharge status: Home / Self Care | Payer: No Typology Code available for payment source | Source: Ambulatory Visit | Attending: Orthopedic Surgery | Admitting: Orthopedic Surgery

## 2018-01-10 ENCOUNTER — Encounter (HOSPITAL_COMMUNITY)
Admission: RE | Disposition: A | Discharge status: Home / Self Care | Payer: Self-pay | Source: Ambulatory Visit | Attending: Orthopedic Surgery

## 2018-01-10 ENCOUNTER — Encounter (HOSPITAL_COMMUNITY): Payer: Self-pay

## 2018-01-10 DIAGNOSIS — Z87891 Personal history of nicotine dependence: Secondary | ICD-10-CM | POA: Insufficient documentation

## 2018-01-10 DIAGNOSIS — M24562 Contracture, left knee: Secondary | ICD-10-CM | POA: Insufficient documentation

## 2018-01-10 DIAGNOSIS — F419 Anxiety disorder, unspecified: Secondary | ICD-10-CM | POA: Insufficient documentation

## 2018-01-10 DIAGNOSIS — Z8781 Personal history of (healed) traumatic fracture: Secondary | ICD-10-CM

## 2018-01-10 DIAGNOSIS — Z79899 Other long term (current) drug therapy: Secondary | ICD-10-CM | POA: Insufficient documentation

## 2018-01-10 HISTORY — PX: KNEE CLOSED REDUCTION: SHX995

## 2018-01-10 LAB — URINALYSIS, ROUTINE W REFLEX MICROSCOPIC
BILIRUBIN URINE: NEGATIVE
GLUCOSE, UA: NEGATIVE mg/dL
Hgb urine dipstick: NEGATIVE
Ketones, ur: NEGATIVE mg/dL
Nitrite: NEGATIVE
Protein, ur: NEGATIVE mg/dL
SPECIFIC GRAVITY, URINE: 1.02 (ref 1.005–1.030)
pH: 6 (ref 5.0–8.0)

## 2018-01-10 LAB — CBC
HCT: 31.7 % — ABNORMAL LOW (ref 36.0–46.0)
HEMOGLOBIN: 10.1 g/dL — AB (ref 12.0–15.0)
MCH: 30.2 pg (ref 26.0–34.0)
MCHC: 31.9 g/dL (ref 30.0–36.0)
MCV: 94.9 fL (ref 78.0–100.0)
PLATELETS: 515 10*3/uL — AB (ref 150–400)
RBC: 3.34 MIL/uL — AB (ref 3.87–5.11)
RDW: 14.7 % (ref 11.5–15.5)
WBC: 6.3 10*3/uL (ref 4.0–10.5)

## 2018-01-10 LAB — POCT PREGNANCY, URINE: Preg Test, Ur: NEGATIVE

## 2018-01-10 SURGERY — MANIPULATION, KNEE, CLOSED
Anesthesia: General | Site: Knee | Laterality: Left

## 2018-01-10 MED ORDER — MIDAZOLAM HCL 2 MG/2ML IJ SOLN
INTRAMUSCULAR | Status: DC | PRN
  Administered 2018-01-10 (×2): 1 mg via INTRAVENOUS

## 2018-01-10 MED ORDER — BUPIVACAINE-EPINEPHRINE (PF) 0.25% -1:200000 IJ SOLN
INTRAMUSCULAR | Status: DC | PRN
  Administered 2018-01-10: 5 mL

## 2018-01-10 MED ORDER — LIDOCAINE 2% (20 MG/ML) 5 ML SYRINGE
INTRAMUSCULAR | Status: AC
  Filled 2018-01-10: qty 5

## 2018-01-10 MED ORDER — MIDAZOLAM HCL 2 MG/2ML IJ SOLN
INTRAMUSCULAR | Status: AC
  Filled 2018-01-10: qty 2

## 2018-01-10 MED ORDER — PROPOFOL 10 MG/ML IV BOLUS
INTRAVENOUS | Status: AC
  Filled 2018-01-10: qty 20

## 2018-01-10 MED ORDER — GLYCOPYRROLATE 0.2 MG/ML IJ SOLN
INTRAMUSCULAR | Status: DC | PRN
  Administered 2018-01-10: 0.2 mg via INTRAVENOUS

## 2018-01-10 MED ORDER — FENTANYL CITRATE (PF) 250 MCG/5ML IJ SOLN
INTRAMUSCULAR | Status: AC
  Filled 2018-01-10: qty 5

## 2018-01-10 MED ORDER — BUPIVACAINE-EPINEPHRINE 0.25% -1:200000 IJ SOLN
INTRAMUSCULAR | Status: AC
  Filled 2018-01-10: qty 1

## 2018-01-10 MED ORDER — ONDANSETRON 4 MG PO TBDP: 4.0000 mg | ORAL_TABLET | Freq: Three times a day (TID) | ORAL | 0 refills | Status: DC | PRN

## 2018-01-10 MED ORDER — HYDROMORPHONE HCL 1 MG/ML IJ SOLN
INTRAMUSCULAR | Status: AC
  Filled 2018-01-10: qty 1

## 2018-01-10 MED ORDER — OXYCODONE-ACETAMINOPHEN 5-325 MG PO TABS: 1.0000 | ORAL_TABLET | Freq: Four times a day (QID) | ORAL | 0 refills | Status: DC | PRN

## 2018-01-10 MED ORDER — SUCCINYLCHOLINE CHLORIDE 200 MG/10ML IV SOSY
PREFILLED_SYRINGE | INTRAVENOUS | Status: AC
  Filled 2018-01-10: qty 10

## 2018-01-10 MED ORDER — HYDROMORPHONE HCL 1 MG/ML IJ SOLN
0.2500 mg | INTRAMUSCULAR | Status: DC | PRN
  Administered 2018-01-10 (×4): 0.5 mg via INTRAVENOUS

## 2018-01-10 MED ORDER — PROPOFOL 10 MG/ML IV BOLUS
INTRAVENOUS | Status: DC | PRN
  Administered 2018-01-10: 25 mg via INTRAVENOUS
  Administered 2018-01-10: 200 mg via INTRAVENOUS

## 2018-01-10 MED ORDER — ONDANSETRON HCL 4 MG/2ML IJ SOLN
INTRAMUSCULAR | Status: DC | PRN
  Administered 2018-01-10: 4 mg via INTRAVENOUS

## 2018-01-10 MED ORDER — LACTATED RINGERS IV SOLN
INTRAVENOUS | Status: DC
  Administered 2018-01-10 (×2): via INTRAVENOUS

## 2018-01-10 MED ORDER — ONDANSETRON HCL 4 MG/2ML IJ SOLN
INTRAMUSCULAR | Status: AC
  Filled 2018-01-10: qty 2

## 2018-01-10 MED ORDER — LIDOCAINE HCL (CARDIAC) 20 MG/ML IV SOLN
INTRAVENOUS | Status: DC | PRN
  Administered 2018-01-10: 50 mg via INTRATRACHEAL

## 2018-01-10 MED ORDER — SUCCINYLCHOLINE CHLORIDE 20 MG/ML IJ SOLN
INTRAMUSCULAR | Status: DC | PRN
  Administered 2018-01-10: 100 mg via INTRAVENOUS

## 2018-01-10 MED ORDER — FENTANYL CITRATE (PF) 250 MCG/5ML IJ SOLN
INTRAMUSCULAR | Status: DC | PRN
  Administered 2018-01-10: 50 ug via INTRAVENOUS

## 2018-01-10 MED ORDER — METOPROLOL TARTARATE 1 MG/ML SYRINGE (5ML)
Status: DC | PRN
  Administered 2018-01-10 (×2): 1 mg via INTRAVENOUS
  Administered 2018-01-10: 2 mg via INTRAVENOUS
  Administered 2018-01-10: 1 mg via INTRAVENOUS

## 2018-01-10 MED ORDER — FENTANYL CITRATE (PF) 100 MCG/2ML IJ SOLN
INTRAMUSCULAR | Status: AC
  Filled 2018-01-10: qty 2

## 2018-01-10 SURGICAL SUPPLY — 10 items
DRAPE C-ARMOR (DRAPES) ×3 IMPLANT
GLOVE BIO SURGEON STRL SZ7.5 (GLOVE) ×3 IMPLANT
GLOVE BIOGEL PI IND STRL 7.5 (GLOVE) ×1 IMPLANT
GLOVE BIOGEL PI IND STRL 8 (GLOVE) ×1 IMPLANT
GLOVE BIOGEL PI INDICATOR 7.5 (GLOVE) ×2
GLOVE BIOGEL PI INDICATOR 8 (GLOVE) ×2
GOWN STRL REUS W/ TWL XL LVL3 (GOWN DISPOSABLE) ×1 IMPLANT
GOWN STRL REUS W/TWL XL LVL3 (GOWN DISPOSABLE) ×2
KIT ROOM TURNOVER OR (KITS) ×3 IMPLANT
PAD ARMBOARD 7.5X6 YLW CONV (MISCELLANEOUS) ×6 IMPLANT

## 2018-01-10 NOTE — Anesthesia Postprocedure Evaluation (Signed)
Anesthesia Post Note  Patient: [name]  Procedure(s) Performed: CLOSED MANIPULATION KNEE (Left Knee)     Patient location during evaluation: PACU Anesthesia Type: General Level of consciousness: awake and alert Pain management: pain level controlled Vital Signs Assessment: post-procedure vital signs reviewed and stable Respiratory status: spontaneous breathing, nonlabored ventilation, respiratory function stable and patient connected to nasal cannula oxygen Cardiovascular status: blood pressure returned to baseline and stable Postop Assessment: no apparent nausea or vomiting Anesthetic complications: no    Last Vitals:  Vitals:   01/10/18 1232 01/10/18 1237  BP: (!) 122/98   Pulse: (!) 114 (!) 116  Resp: 14 13  Temp:  36.5 C  SpO2: 100% 98%                 Shelton SilvasKevin D Hollis

## 2018-01-10 NOTE — Discharge Instructions (Addendum)
Aggressive motion left knee No motion restrictions left knee Nonweightbearing left leg  Weightbearing on right leg to help with transfers  Do not put a pillow under the Left knee when at rest  Do not use leg rests on wheel chair

## 2018-01-10 NOTE — Anesthesia Preprocedure Evaluation (Addendum)
Anesthesia Evaluation  Patient identified by MRN, date of birth, ID band Patient awake    Reviewed: Allergy & Precautions, H&P , NPO status , Patient's Chart, lab work & pertinent test results  Airway Mallampati: II  TM Distance: >3 FB Neck ROM: Full    Dental no notable dental hx. (+) Teeth Intact, Dental Advisory Given   Pulmonary neg pulmonary ROS, former smoker,    Pulmonary exam normal breath sounds clear to auscultation       Cardiovascular negative cardio ROS   Rhythm:Regular Rate:Normal     Neuro/Psych Seizures -, Well Controlled,  Anxiety negative psych ROS   GI/Hepatic negative GI ROS, Neg liver ROS,   Endo/Other  negative endocrine ROS  Renal/GU negative Renal ROS  negative genitourinary   Musculoskeletal   Abdominal   Peds  Hematology negative hematology ROS (+)   Anesthesia Other Findings   Reproductive/Obstetrics negative OB ROS                            Anesthesia Physical Anesthesia Plan  ASA: II  Anesthesia Plan: General   Post-op Pain Management:    Induction: Intravenous  PONV Risk Score and Plan: 4 or greater and Ondansetron, Dexamethasone and Midazolam  Airway Management Planned: Mask  Additional Equipment:   Intra-op Plan:   Post-operative Plan: Extubation in OR  Informed Consent: I have reviewed the patients History and Physical, chart, labs and discussed the procedure including the risks, benefits and alternatives for the proposed anesthesia with the patient or authorized representative who has indicated his/her understanding and acceptance.   Dental advisory given  Plan Discussed with: CRNA  Anesthesia Plan Comments:         Anesthesia Quick Evaluation

## 2018-01-10 NOTE — Brief Op Note (Signed)
01/10/2018  11:42 AM  PATIENT:  Konrad Doloresarian Raftery  27 y.o. female  PRE-OPERATIVE DIAGNOSIS:  post op contracture  POST-OPERATIVE DIAGNOSIS:  post op contracture  PROCEDURE:  Procedure(s): 1. CLOSED MANIPULATION KNEE (Left) under anesthesia 2. Aspiration and injection left knee  SURGEON:  Surgeon(s) and Role:    Myrene Galas* Catalino Plascencia, MD - Primary  PHYSICIAN ASSISTANT: PA Student  ANESTHESIA:   general  EBL:  None  BLOOD ADMINISTERED:none  DRAINS: none   LOCAL MEDICATIONS USED:  MARCAINE     SPECIMEN:  No Specimen  DISPOSITION OF SPECIMEN:  N/A  COUNTS:  YES  TOURNIQUET:  * No tourniquets in log *  DICTATION: .Other Dictation: Dictation Number [id number]  PLAN OF CARE: Discharge to home after PACU  PATIENT DISPOSITION:  PACU - hemodynamically stable.   Delay start of Pharmacological VTE agent (>24hrs) due to surgical blood loss or risk of bleeding: not applicable

## 2018-01-10 NOTE — Transfer of Care (Signed)
Immediate Anesthesia Transfer of Care Note  Patient: [name]  Procedure(s) Performed: CLOSED MANIPULATION KNEE (Left Knee)  Patient Location: PACU  Anesthesia Type:General  Level of Consciousness: awake and alert   Airway & Oxygen Therapy: Patient connected to face mask oxygen  Post-op Assessment: Report given to RN, Post -op Vital signs reviewed and stable, Patient moving all extremities X 4 and Patient able to stick tongue midline  Post vital signs: Reviewed and stable  Last Vitals:  Vitals:   01/10/18 0758 01/10/18 1149  BP: 118/80 (!) 112/94  Pulse: (!) 112 (!) 120  Resp: 16 15  Temp: 36.9 C (!) 36.3 C  SpO2: 100% 99%    Last Pain:  Vitals:   01/10/18 0758  TempSrc: Oral  PainSc:          Complications: No apparent anesthesia complications
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
## 2018-01-11 ENCOUNTER — Encounter (HOSPITAL_COMMUNITY): Payer: Self-pay | Admitting: Orthopedic Surgery

## 2018-01-13 MED ORDER — CLONAZEPAM 0.25 MG PO TBDP: 0.2500 mg | ORAL_TABLET | Freq: Two times a day (BID) | ORAL | 0 refills | Status: DC

## 2018-01-13 NOTE — Addendum Note (Signed)
Addended by: Doreene ElandSHUMAKER, Aiyanna Awtrey W on: 01/13/2018 02:40 PM   Modules accepted: Orders

## 2018-01-13 NOTE — Telephone Encounter (Addendum)
Called and spoke with mother and she does not have a neuro psych.  I called in #10 clonazepam 0.25 mg 1 po bid for anxiety.  She has appt with Dr Allena KatzPatel on 01/17/18. Referral placed to [facility].
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
## 2018-01-15 ENCOUNTER — Telehealth: Payer: Self-pay

## 2018-01-15 ENCOUNTER — Encounter: Payer: Self-pay | Admitting: Physical Medicine & Rehabilitation

## 2018-01-15 NOTE — Telephone Encounter (Signed)
Patient called today, states unable to receive clonazepam from pharmacy, she stated they do not carry the strength or capacity of the medication, the dissolvable types, has asked for just the regular generic type.  Also she stated that she does not have insurance and will have to use goodrx for her meds so it has to be generic.

## 2018-01-15 NOTE — Telephone Encounter (Signed)
We may prescribe her the generic form.  Thanks

## 2018-01-16 DIAGNOSIS — Z79891 Long term (current) use of opiate analgesic: Secondary | ICD-10-CM

## 2018-01-16 DIAGNOSIS — S92902D Unspecified fracture of left foot, subsequent encounter for fracture with routine healing: Secondary | ICD-10-CM

## 2018-01-16 DIAGNOSIS — S0511XD Contusion of eyeball and orbital tissues, right eye, subsequent encounter: Secondary | ICD-10-CM

## 2018-01-16 DIAGNOSIS — Z87440 Personal history of urinary (tract) infections: Secondary | ICD-10-CM

## 2018-01-16 DIAGNOSIS — S71112D Laceration without foreign body, left thigh, subsequent encounter: Secondary | ICD-10-CM

## 2018-01-16 DIAGNOSIS — F419 Anxiety disorder, unspecified: Secondary | ICD-10-CM

## 2018-01-16 DIAGNOSIS — S062X0D Diffuse traumatic brain injury without loss of consciousness, subsequent encounter: Secondary | ICD-10-CM

## 2018-01-16 DIAGNOSIS — R569 Unspecified convulsions: Secondary | ICD-10-CM

## 2018-01-16 DIAGNOSIS — S72402D Unspecified fracture of lower end of left femur, subsequent encounter for closed fracture with routine healing: Secondary | ICD-10-CM

## 2018-01-16 DIAGNOSIS — S32810D Multiple fractures of pelvis with stable disruption of pelvic ring, subsequent encounter for fracture with routine healing: Secondary | ICD-10-CM

## 2018-01-16 DIAGNOSIS — S36113D Laceration of liver, unspecified degree, subsequent encounter: Secondary | ICD-10-CM

## 2018-01-16 MED ORDER — CLONAZEPAM 0.5 MG PO TABS: 0.2500 mg | ORAL_TABLET | Freq: Every day | ORAL | 0 refills | Status: DC

## 2018-01-16 NOTE — Addendum Note (Signed)
Addended by: Doreene ElandSHUMAKER, Terri Malerba W on: 01/16/2018 10:35 AM   Modules accepted: Orders

## 2018-01-16 NOTE — Telephone Encounter (Signed)
Klonopin does not come in the .25 mg tablet that is not the disintegrating form in our database, so I have called in klonopin 0.5 mg tablet , take 1/2 tablet daily disp #5 and have notified Jaryah's mother.

## 2018-01-17 ENCOUNTER — Encounter: Payer: Self-pay | Attending: Physical Medicine & Rehabilitation | Admitting: Physical Medicine & Rehabilitation

## 2018-01-17 ENCOUNTER — Encounter: Payer: Self-pay | Admitting: Physical Medicine & Rehabilitation

## 2018-01-17 VITALS — BP 135/81 | HR 112 | Temp 98.7°F

## 2018-01-17 DIAGNOSIS — R339 Retention of urine, unspecified: Secondary | ICD-10-CM | POA: Insufficient documentation

## 2018-01-17 DIAGNOSIS — S92902S Unspecified fracture of left foot, sequela: Secondary | ICD-10-CM

## 2018-01-17 DIAGNOSIS — S069X1S Unspecified intracranial injury with loss of consciousness of 30 minutes or less, sequela: Secondary | ICD-10-CM

## 2018-01-17 DIAGNOSIS — R269 Unspecified abnormalities of gait and mobility: Secondary | ICD-10-CM

## 2018-01-17 DIAGNOSIS — G8918 Other acute postprocedural pain: Secondary | ICD-10-CM

## 2018-01-17 DIAGNOSIS — T1490XA Injury, unspecified, initial encounter: Secondary | ICD-10-CM

## 2018-01-17 DIAGNOSIS — S92902D Unspecified fracture of left foot, subsequent encounter for fracture with routine healing: Secondary | ICD-10-CM | POA: Insufficient documentation

## 2018-01-17 DIAGNOSIS — Z8781 Personal history of (healed) traumatic fracture: Secondary | ICD-10-CM

## 2018-01-17 DIAGNOSIS — Z87820 Personal history of traumatic brain injury: Secondary | ICD-10-CM | POA: Insufficient documentation

## 2018-01-17 DIAGNOSIS — Z79899 Other long term (current) drug therapy: Secondary | ICD-10-CM

## 2018-01-17 DIAGNOSIS — F410 Panic disorder [episodic paroxysmal anxiety] without agoraphobia: Secondary | ICD-10-CM

## 2018-01-17 DIAGNOSIS — Z87891 Personal history of nicotine dependence: Secondary | ICD-10-CM | POA: Insufficient documentation

## 2018-01-17 DIAGNOSIS — F411 Generalized anxiety disorder: Secondary | ICD-10-CM

## 2018-01-17 DIAGNOSIS — X58XXXD Exposure to other specified factors, subsequent encounter: Secondary | ICD-10-CM | POA: Insufficient documentation

## 2018-01-17 MED ORDER — TRAMADOL HCL 50 MG PO TABS: 50.0000 mg | ORAL_TABLET | Freq: Two times a day (BID) | ORAL | 0 refills | Status: DC | PRN

## 2018-01-17 MED ORDER — ESCITALOPRAM OXALATE 5 MG PO TABS: 5.0000 mg | ORAL_TABLET | Freq: Every day | ORAL | 1 refills | Status: DC
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
## 2018-02-19 ENCOUNTER — Ambulatory Visit: Payer: Self-pay | Admitting: Physical Medicine & Rehabilitation

## 2018-02-26 ENCOUNTER — Encounter (HOSPITAL_COMMUNITY): Payer: Self-pay | Admitting: Orthopedic Surgery

## 2018-03-13 ENCOUNTER — Ambulatory Visit: Payer: Self-pay | Admitting: Psychology

## 2018-11-05 NOTE — Pre-Procedure Instructions (Signed)
Michele DoloresDarian Robertson  11/05/2018      [id number] - [place], [place] - [address] [place] [id number] Phone: [phone] Fax: [phone]    Your procedure is scheduled on Thursday December 5.  [facility] Admitting at 6:00 A.M.  Call this number if you have problems the morning of surgery:  [phone]   Remember:  Do not eat or drink after midnight.    Take these medicines the morning of surgery with A SIP OF WATER: Lexapro (Escitalopram)  7 days prior to surgery STOP taking any Aspirin(unless otherwise instructed by your surgeon), Aleve, Naproxen, Ibuprofen, Motrin, Advil, Goody's, BC's, all herbal medications, fish oil, and all vitamins  Be prepared to give a urine sample upon arrival the day of surgery.      Do not wear jewelry, make-up or nail polish.  Do not wear lotions, powders, or perfumes, or deodorant.  Do not shave 48 hours prior to surgery.  Men may shave face and neck.  Do not bring valuables to the hospital.  [facility] is not responsible for any belongings or valuables.  Contacts, dentures or bridgework may not be worn into surgery.  Leave your suitcase in the car.  After surgery it may be brought to your room.  For patients admitted to the hospital, discharge time will be determined by your treatment team.  Patients discharged the day of surgery will not be allowed to drive home.   Special instructions:    [place]- Preparing For Surgery  Before surgery, you can play an important role. Because skin is not sterile, your skin needs to be as free of germs as possible. You can reduce the number of germs on your skin by washing with CHG (chlorahexidine gluconate) Soap before surgery.  CHG is an antiseptic cleaner which kills germs and bonds with the skin to continue killing germs even after washing.    Oral Hygiene is also important to reduce your risk of infection.  Remember - BRUSH YOUR TEETH  THE MORNING OF SURGERY WITH YOUR REGULAR TOOTHPASTE  Please do not use if you have an allergy to CHG or antibacterial soaps. If your skin becomes reddened/irritated stop using the CHG.  Do not shave (including legs and underarms) for at least 48 hours prior to first CHG shower. It is OK to shave your face.  Please follow these instructions carefully.   1. Shower the NIGHT BEFORE SURGERY and the MORNING OF SURGERY with CHG.   2. If you chose to wash your hair, wash your hair first as usual with your normal shampoo.  3. After you shampoo, rinse your hair and body thoroughly to remove the shampoo.  4. Use CHG as you would any other liquid soap. You can apply CHG directly to the skin and wash gently with a scrungie or a clean washcloth.   5. Apply the CHG Soap to your body ONLY FROM THE NECK DOWN.  Do not use on open wounds or open sores. Avoid contact with your eyes, ears, mouth and genitals (private parts). Wash Face and genitals (private parts)  with your normal soap.  6. Wash thoroughly, paying special attention to the area where your surgery will be performed.  7. Thoroughly rinse your body with warm water from the neck down.  8. DO NOT shower/wash with your normal soap after using and rinsing off the CHG Soap.  9. Pat yourself dry with a CLEAN TOWEL.  10. Wear CLEAN PAJAMAS to bed the night before surgery, wear comfortable clothes the morning of surgery  11. Place CLEAN SHEETS on your bed the night of your first shower and DO NOT SLEEP WITH PETS.    Day of Surgery:  Do not apply any deodorants/lotions.  Please wear clean clothes to the hospital/surgery center.   Remember to brush your teeth WITH YOUR REGULAR TOOTHPASTE.    Please read over the following fact sheets that you were given. Coughing and Deep Breathing and Surgical Site Infection Prevention

## 2018-11-07 ENCOUNTER — Other Ambulatory Visit: Payer: Self-pay

## 2018-11-07 ENCOUNTER — Encounter (HOSPITAL_COMMUNITY): Payer: Self-pay

## 2018-11-07 ENCOUNTER — Encounter (HOSPITAL_COMMUNITY)
Admission: RE | Admit: 2018-11-07 | Discharge: 2018-11-07 | Disposition: A | Discharge status: Home / Self Care | Payer: Self-pay | Source: Ambulatory Visit | Attending: Orthopedic Surgery | Admitting: Orthopedic Surgery

## 2018-11-07 DIAGNOSIS — Z472 Encounter for removal of internal fixation device: Secondary | ICD-10-CM | POA: Insufficient documentation

## 2018-11-07 DIAGNOSIS — Z01818 Encounter for other preprocedural examination: Secondary | ICD-10-CM | POA: Insufficient documentation

## 2018-11-07 LAB — CBC
HCT: 41.9 % (ref 36.0–46.0)
HEMOGLOBIN: 13.6 g/dL (ref 12.0–15.0)
MCH: 30 pg (ref 26.0–34.0)
MCHC: 32.5 g/dL (ref 30.0–36.0)
MCV: 92.3 fL (ref 80.0–100.0)
NRBC: 0 % (ref 0.0–0.2)
PLATELETS: 268 10*3/uL (ref 150–400)
RBC: 4.54 MIL/uL (ref 3.87–5.11)
RDW: 12.7 % (ref 11.5–15.5)
WBC: 9.9 10*3/uL (ref 4.0–10.5)

## 2018-11-07 LAB — BASIC METABOLIC PANEL
ANION GAP: 10 (ref 5–15)
BUN: 6 mg/dL (ref 6–20)
CALCIUM: 9.2 mg/dL (ref 8.9–10.3)
CO2: 21 mmol/L — ABNORMAL LOW (ref 22–32)
Chloride: 104 mmol/L (ref 98–111)
Creatinine, Ser: 0.64 mg/dL (ref 0.44–1.00)
Glucose, Bld: 85 mg/dL (ref 70–99)
POTASSIUM: 3.9 mmol/L (ref 3.5–5.1)
SODIUM: 135 mmol/L (ref 135–145)

## 2018-11-07 LAB — SURGICAL PCR SCREEN
MRSA, PCR: NEGATIVE
STAPHYLOCOCCUS AUREUS: NEGATIVE

## 2018-11-07 NOTE — Progress Notes (Signed)
PCP - Antony OdeaJason Vaughn in [place] Cardiologist - denies  Chest x-ray - 12/21/17 Patient denies shortness of breath, fever, cough and chest pain at PAT appointment   Patient verbalized understanding of instructions that were given to them at the PAT appointment. Patient was also instructed that they will need to review over the PAT instructions again at home before surgery.
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
## 2018-11-13 ENCOUNTER — Encounter (HOSPITAL_COMMUNITY): Payer: Self-pay | Admitting: Registered Nurse

## 2018-11-13 ENCOUNTER — Ambulatory Visit (HOSPITAL_COMMUNITY): Payer: Self-pay | Admitting: Registered Nurse

## 2018-11-13 ENCOUNTER — Encounter (HOSPITAL_COMMUNITY)
Admission: RE | Disposition: A | Discharge status: Home / Self Care | Payer: Self-pay | Source: Ambulatory Visit | Attending: Orthopedic Surgery

## 2018-11-13 ENCOUNTER — Ambulatory Visit (HOSPITAL_COMMUNITY): Payer: Self-pay

## 2018-11-13 ENCOUNTER — Other Ambulatory Visit: Payer: Self-pay

## 2018-11-13 ENCOUNTER — Ambulatory Visit (HOSPITAL_COMMUNITY)
Admission: RE | Admit: 2018-11-13 | Discharge: 2018-11-13 | Disposition: A | Discharge status: Home / Self Care | Payer: Self-pay | Source: Ambulatory Visit | Attending: Orthopedic Surgery | Admitting: Orthopedic Surgery

## 2018-11-13 DIAGNOSIS — Z419 Encounter for procedure for purposes other than remedying health state, unspecified: Secondary | ICD-10-CM

## 2018-11-13 DIAGNOSIS — Z472 Encounter for removal of internal fixation device: Secondary | ICD-10-CM | POA: Insufficient documentation

## 2018-11-13 DIAGNOSIS — Z87891 Personal history of nicotine dependence: Secondary | ICD-10-CM | POA: Insufficient documentation

## 2018-11-13 DIAGNOSIS — Z793 Long term (current) use of hormonal contraceptives: Secondary | ICD-10-CM | POA: Insufficient documentation

## 2018-11-13 DIAGNOSIS — G40909 Epilepsy, unspecified, not intractable, without status epilepticus: Secondary | ICD-10-CM | POA: Insufficient documentation

## 2018-11-13 DIAGNOSIS — Z79899 Other long term (current) drug therapy: Secondary | ICD-10-CM | POA: Insufficient documentation

## 2018-11-13 DIAGNOSIS — F419 Anxiety disorder, unspecified: Secondary | ICD-10-CM | POA: Insufficient documentation

## 2018-11-13 HISTORY — PX: HARDWARE REMOVAL: SHX979

## 2018-11-13 LAB — POCT PREGNANCY, URINE: Preg Test, Ur: NEGATIVE

## 2018-11-13 SURGERY — REMOVAL, HARDWARE
Anesthesia: General | Site: Leg Upper | Laterality: Left

## 2018-11-13 MED ORDER — HYDROCODONE-ACETAMINOPHEN 7.5-325 MG PO TABS
ORAL_TABLET | ORAL | Status: AC
  Filled 2018-11-13: qty 1

## 2018-11-13 MED ORDER — MIDAZOLAM HCL 2 MG/2ML IJ SOLN
INTRAMUSCULAR | Status: AC
  Filled 2018-11-13: qty 2

## 2018-11-13 MED ORDER — DEXAMETHASONE SODIUM PHOSPHATE 10 MG/ML IJ SOLN
INTRAMUSCULAR | Status: DC | PRN
  Administered 2018-11-13: 10 mg via INTRAVENOUS

## 2018-11-13 MED ORDER — FENTANYL CITRATE (PF) 250 MCG/5ML IJ SOLN
INTRAMUSCULAR | Status: AC
  Filled 2018-11-13: qty 5

## 2018-11-13 MED ORDER — ONDANSETRON 4 MG PO TBDP: 4.0000 mg | ORAL_TABLET | Freq: Three times a day (TID) | ORAL | 0 refills | Status: AC | PRN

## 2018-11-13 MED ORDER — SODIUM CHLORIDE 0.9 % IR SOLN
Status: DC | PRN
  Administered 2018-11-13: 20 mL

## 2018-11-13 MED ORDER — METOCLOPRAMIDE HCL 5 MG/ML IJ SOLN: 10.0000 mg | Freq: Once | INTRAMUSCULAR | Status: DC | PRN

## 2018-11-13 MED ORDER — 0.9 % SODIUM CHLORIDE (POUR BTL) OPTIME
TOPICAL | Status: DC | PRN
  Administered 2018-11-13: 1000 mL

## 2018-11-13 MED ORDER — CEFAZOLIN SODIUM-DEXTROSE 2-4 GM/100ML-% IV SOLN
INTRAVENOUS | Status: AC
  Filled 2018-11-13: qty 100

## 2018-11-13 MED ORDER — DEXAMETHASONE SODIUM PHOSPHATE 10 MG/ML IJ SOLN
INTRAMUSCULAR | Status: AC
  Filled 2018-11-13: qty 1

## 2018-11-13 MED ORDER — KETOROLAC TROMETHAMINE 30 MG/ML IJ SOLN
30.0000 mg | Freq: Once | INTRAMUSCULAR | Status: AC
  Administered 2018-11-13: 30 mg via INTRAVENOUS

## 2018-11-13 MED ORDER — HYDROMORPHONE HCL 1 MG/ML IJ SOLN
INTRAMUSCULAR | Status: AC
  Filled 2018-11-13: qty 1

## 2018-11-13 MED ORDER — LIDOCAINE 2% (20 MG/ML) 5 ML SYRINGE
INTRAMUSCULAR | Status: DC | PRN
  Administered 2018-11-13: 80 mg via INTRAVENOUS

## 2018-11-13 MED ORDER — GABAPENTIN 300 MG PO CAPS
ORAL_CAPSULE | ORAL | Status: AC
  Administered 2018-11-13: 300 mg via ORAL
  Filled 2018-11-13: qty 1

## 2018-11-13 MED ORDER — KETOROLAC TROMETHAMINE 30 MG/ML IJ SOLN
INTRAMUSCULAR | Status: DC | PRN
  Administered 2018-11-13: 30 mg via INTRAVENOUS

## 2018-11-13 MED ORDER — ACETAMINOPHEN 500 MG PO TABS
ORAL_TABLET | ORAL | Status: AC
  Administered 2018-11-13: 1000 mg via ORAL
  Filled 2018-11-13: qty 2

## 2018-11-13 MED ORDER — HYDROMORPHONE HCL 1 MG/ML IJ SOLN
0.2500 mg | INTRAMUSCULAR | Status: DC | PRN
  Administered 2018-11-13 (×4): 0.5 mg via INTRAVENOUS

## 2018-11-13 MED ORDER — BUPIVACAINE LIPOSOME 1.3 % IJ SUSP
20.0000 mL | INTRAMUSCULAR | Status: AC
  Administered 2018-11-13: 20 mL
  Filled 2018-11-13: qty 20

## 2018-11-13 MED ORDER — HYDROMORPHONE HCL 1 MG/ML IJ SOLN
INTRAMUSCULAR | Status: DC | PRN
  Administered 2018-11-13 (×2): .25 mg via INTRAVENOUS

## 2018-11-13 MED ORDER — ONDANSETRON HCL 4 MG/2ML IJ SOLN
INTRAMUSCULAR | Status: DC | PRN
  Administered 2018-11-13: 4 mg via INTRAVENOUS

## 2018-11-13 MED ORDER — MEPERIDINE HCL 50 MG/ML IJ SOLN: 6.2500 mg | INTRAMUSCULAR | Status: DC | PRN

## 2018-11-13 MED ORDER — MIDAZOLAM HCL 5 MG/5ML IJ SOLN
INTRAMUSCULAR | Status: DC | PRN
  Administered 2018-11-13 (×2): 2 mg via INTRAVENOUS

## 2018-11-13 MED ORDER — HYDROMORPHONE HCL 1 MG/ML IJ SOLN
INTRAMUSCULAR | Status: AC
  Filled 2018-11-13: qty 0.5

## 2018-11-13 MED ORDER — KETOROLAC TROMETHAMINE 10 MG PO TABS: 10.0000 mg | ORAL_TABLET | Freq: Four times a day (QID) | ORAL | 0 refills | Status: AC | PRN

## 2018-11-13 MED ORDER — KETOROLAC TROMETHAMINE 30 MG/ML IJ SOLN: 30.0000 mg | Freq: Once | INTRAMUSCULAR | Status: DC

## 2018-11-13 MED ORDER — LIDOCAINE 2% (20 MG/ML) 5 ML SYRINGE
INTRAMUSCULAR | Status: AC
  Filled 2018-11-13: qty 5

## 2018-11-13 MED ORDER — FENTANYL CITRATE (PF) 250 MCG/5ML IJ SOLN
INTRAMUSCULAR | Status: DC | PRN
  Administered 2018-11-13 (×4): 25 ug via INTRAVENOUS
  Administered 2018-11-13: 100 ug via INTRAVENOUS
  Administered 2018-11-13 (×3): 25 ug via INTRAVENOUS
  Administered 2018-11-13: 50 ug via INTRAVENOUS
  Administered 2018-11-13 (×3): 25 ug via INTRAVENOUS

## 2018-11-13 MED ORDER — HYDROCODONE-ACETAMINOPHEN 5-325 MG PO TABS: 1.0000 | ORAL_TABLET | Freq: Three times a day (TID) | ORAL | 0 refills | Status: AC | PRN

## 2018-11-13 MED ORDER — ACETAMINOPHEN 500 MG PO TABS
1000.0000 mg | ORAL_TABLET | Freq: Once | ORAL | Status: AC
  Administered 2018-11-13: 1000 mg via ORAL

## 2018-11-13 MED ORDER — CHLORHEXIDINE GLUCONATE 4 % EX LIQD: 60.0000 mL | Freq: Once | CUTANEOUS | Status: DC

## 2018-11-13 MED ORDER — CEFAZOLIN SODIUM-DEXTROSE 2-4 GM/100ML-% IV SOLN
2.0000 g | INTRAVENOUS | Status: AC
  Administered 2018-11-13: 2 g via INTRAVENOUS

## 2018-11-13 MED ORDER — PROPOFOL 10 MG/ML IV BOLUS
INTRAVENOUS | Status: AC
  Filled 2018-11-13: qty 20

## 2018-11-13 MED ORDER — LACTATED RINGERS IV SOLN
INTRAVENOUS | Status: DC
  Administered 2018-11-13 (×2): via INTRAVENOUS

## 2018-11-13 MED ORDER — GABAPENTIN 300 MG PO CAPS
300.0000 mg | ORAL_CAPSULE | Freq: Once | ORAL | Status: AC
  Administered 2018-11-13: 300 mg via ORAL

## 2018-11-13 MED ORDER — HYDROCODONE-ACETAMINOPHEN 7.5-325 MG PO TABS
1.0000 | ORAL_TABLET | Freq: Once | ORAL | Status: AC | PRN
  Administered 2018-11-13: 1 via ORAL

## 2018-11-13 MED ORDER — KETOROLAC TROMETHAMINE 30 MG/ML IJ SOLN
INTRAMUSCULAR | Status: AC
  Filled 2018-11-13: qty 1

## 2018-11-13 MED ORDER — PROPOFOL 10 MG/ML IV BOLUS
INTRAVENOUS | Status: DC | PRN
  Administered 2018-11-13: 180 mg via INTRAVENOUS

## 2018-11-13 MED ORDER — ONDANSETRON HCL 4 MG/2ML IJ SOLN
INTRAMUSCULAR | Status: AC
  Filled 2018-11-13: qty 2

## 2018-11-13 SURGICAL SUPPLY — 59 items
BANDAGE ACE 4X5 VEL STRL LF (GAUZE/BANDAGES/DRESSINGS) ×3 IMPLANT
BANDAGE ACE 6X5 VEL STRL LF (GAUZE/BANDAGES/DRESSINGS) ×3 IMPLANT
BANDAGE ESMARK 6X9 LF (GAUZE/BANDAGES/DRESSINGS) ×1 IMPLANT
BNDG COHESIVE 6X5 TAN STRL LF (GAUZE/BANDAGES/DRESSINGS) ×3 IMPLANT
BNDG ESMARK 6X9 LF (GAUZE/BANDAGES/DRESSINGS) ×3
BNDG GAUZE ELAST 4 BULKY (GAUZE/BANDAGES/DRESSINGS) ×6 IMPLANT
BRUSH SCRUB SURG 4.25 DISP (MISCELLANEOUS) ×6 IMPLANT
CLOSURE WOUND 1/2 X4 (GAUZE/BANDAGES/DRESSINGS)
COVER SURGICAL LIGHT HANDLE (MISCELLANEOUS) ×6 IMPLANT
COVER WAND RF STERILE (DRAPES) ×3 IMPLANT
CUFF TOURNIQUET SINGLE 18IN (TOURNIQUET CUFF) IMPLANT
CUFF TOURNIQUET SINGLE 24IN (TOURNIQUET CUFF) IMPLANT
CUFF TOURNIQUET SINGLE 34IN LL (TOURNIQUET CUFF) IMPLANT
DRAPE C-ARM 42X72 X-RAY (DRAPES) IMPLANT
DRAPE C-ARMOR (DRAPES) ×3 IMPLANT
DRAPE U-SHAPE 47X51 STRL (DRAPES) ×3 IMPLANT
DRSG ADAPTIC 3X8 NADH LF (GAUZE/BANDAGES/DRESSINGS) ×3 IMPLANT
DRSG MEPILEX BORDER 4X8 (GAUZE/BANDAGES/DRESSINGS) ×6 IMPLANT
ELECT REM PT RETURN 9FT ADLT (ELECTROSURGICAL) ×3
ELECTRODE REM PT RTRN 9FT ADLT (ELECTROSURGICAL) ×1 IMPLANT
GAUZE SPONGE 4X4 12PLY STRL (GAUZE/BANDAGES/DRESSINGS) ×3 IMPLANT
GLOVE BIO SURGEON STRL SZ7.5 (GLOVE) ×3 IMPLANT
GLOVE BIO SURGEON STRL SZ8 (GLOVE) ×3 IMPLANT
GLOVE BIOGEL PI IND STRL 7.5 (GLOVE) ×1 IMPLANT
GLOVE BIOGEL PI IND STRL 8 (GLOVE) ×1 IMPLANT
GLOVE BIOGEL PI INDICATOR 7.5 (GLOVE) ×2
GLOVE BIOGEL PI INDICATOR 8 (GLOVE) ×2
GOWN STRL REUS W/ TWL LRG LVL3 (GOWN DISPOSABLE) ×2 IMPLANT
GOWN STRL REUS W/ TWL XL LVL3 (GOWN DISPOSABLE) ×1 IMPLANT
GOWN STRL REUS W/TWL LRG LVL3 (GOWN DISPOSABLE) ×4
GOWN STRL REUS W/TWL XL LVL3 (GOWN DISPOSABLE) ×2
KIT BASIN OR (CUSTOM PROCEDURE TRAY) ×3 IMPLANT
KIT TURNOVER KIT B (KITS) ×3 IMPLANT
MANIFOLD NEPTUNE II (INSTRUMENTS) ×3 IMPLANT
NEEDLE 22X1 1/2 (OR ONLY) (NEEDLE) IMPLANT
NS IRRIG 1000ML POUR BTL (IV SOLUTION) ×3 IMPLANT
PACK ORTHO EXTREMITY (CUSTOM PROCEDURE TRAY) ×3 IMPLANT
PAD ARMBOARD 7.5X6 YLW CONV (MISCELLANEOUS) ×6 IMPLANT
PADDING CAST COTTON 6X4 STRL (CAST SUPPLIES) ×9 IMPLANT
SPONGE LAP 18X18 X RAY DECT (DISPOSABLE) ×3 IMPLANT
STAPLER VISISTAT 35W (STAPLE) IMPLANT
STOCKINETTE IMPERVIOUS LG (DRAPES) ×3 IMPLANT
STRIP CLOSURE SKIN 1/2X4 (GAUZE/BANDAGES/DRESSINGS) IMPLANT
SUCTION FRAZIER HANDLE 10FR (MISCELLANEOUS)
SUCTION TUBE FRAZIER 10FR DISP (MISCELLANEOUS) IMPLANT
SUT ETHILON 3 0 PS 1 (SUTURE) IMPLANT
SUT PDS AB 2-0 CT1 27 (SUTURE) IMPLANT
SUT VIC AB 0 CT1 27 (SUTURE)
SUT VIC AB 0 CT1 27XBRD ANBCTR (SUTURE) IMPLANT
SUT VIC AB 2-0 CT1 27 (SUTURE)
SUT VIC AB 2-0 CT1 TAPERPNT 27 (SUTURE) IMPLANT
SYR CONTROL 10ML LL (SYRINGE) IMPLANT
TOWEL OR 17X24 6PK STRL BLUE (TOWEL DISPOSABLE) ×6 IMPLANT
TOWEL OR 17X26 10 PK STRL BLUE (TOWEL DISPOSABLE) ×6 IMPLANT
TUBE CONNECTING 12'X1/4 (SUCTIONS) ×1
TUBE CONNECTING 12X1/4 (SUCTIONS) ×2 IMPLANT
UNDERPAD 30X30 (UNDERPADS AND DIAPERS) ×3 IMPLANT
WATER STERILE IRR 1000ML POUR (IV SOLUTION) ×6 IMPLANT
YANKAUER SUCT BULB TIP NO VENT (SUCTIONS) ×3 IMPLANT

## 2018-11-13 NOTE — Transfer of Care (Signed)
Immediate Anesthesia Transfer of Care Note  Patient: [name]  Procedure(s) Performed: HARDWARE REMOVAL (Left Leg Upper)  Patient Location: PACU  Anesthesia Type:General  Level of Consciousness: awake, alert  and oriented  Airway & Oxygen Therapy: Patient Spontanous Breathing and Patient connected to nasal cannula oxygen  Post-op Assessment: Report given to RN and Post -op Vital signs reviewed and stable  Post vital signs: Reviewed and stable  Last Vitals:  Vitals Value Taken Time  BP 119/71   Temp    Pulse 113   Resp 16   SpO2 99     Last Pain:  Vitals:   11/13/18 0840  TempSrc:   PainSc: 3       Patients Stated Pain Goal: 2 (11/13/18 0840)  Complications: No apparent anesthesia complications

## 2018-11-13 NOTE — Anesthesia Postprocedure Evaluation (Signed)
Anesthesia Post Note  Patient: [name]  Procedure(s) Performed: HARDWARE REMOVAL (Left Leg Upper)     Patient location during evaluation: PACU Anesthesia Type: General Level of consciousness: awake and alert and oriented Pain management: pain level controlled Vital Signs Assessment: post-procedure vital signs reviewed and stable Respiratory status: spontaneous breathing, nonlabored ventilation and respiratory function stable Cardiovascular status: blood pressure returned to baseline and stable Postop Assessment: no apparent nausea or vomiting Anesthetic complications: no    Last Vitals:  Vitals:   11/13/18 1504 11/13/18 1519  BP: 119/71 111/71  Pulse: (!) 111 (!) 102  Resp: 12 16  Temp: (!) 36.3 C   SpO2: 98% 99%    Last Pain:  Vitals:   11/13/18 1504  TempSrc:   PainSc: 7                  Angely Dietz A.

## 2018-11-13 NOTE — Discharge Instructions (Addendum)
[facility]th[facility]uma Service Discharge Instructions   General Discharge Instructions  WEIGHT BEARING STATUS: Weightbearing as tolerated Left leg   RANGE OF MOTION/ACTIVITY: unrestricted range of motion left knee. Activity as tolerated. Slowly increase activity level. You are going to be sore after surgery   Wound Care: daily dressing changes starting on 11/15/2018. See instructions below   Discharge Wound Care Instructions  Do NOT apply any ointments, solutions or lotions to pin sites or surgical wounds.  These prevent needed drainage and even though solutions like hydrogen peroxide kill bacteria, they also damage cells lining the pin sites that help fight infection.  Applying lotions or ointments can keep the wounds moist and can cause them to breakdown and open up as well. This can increase the risk for infection. When in doubt call the office.  Surgical incisions should be dressed daily.  If any drainage is noted, use one layer of adaptic, then gauze, Kerlix, and an ace wrap.  Once the incision is completely dry and without drainage, it may be left open to air out.  Showering may begin 36-48 hours later.  Cleaning gently with soap and water.   Diet: as you were eating previously.  Can use over the counter stool softeners and bowel preparations, such as Miralax, to help with bowel movements.  Narcotics can be constipating.  Be sure to drink plenty of fluids  PAIN MEDICATION USE AND EXPECTATIONS  You have likely been given narcotic medications to help control your pain.  After a traumatic event that results in an fracture (broken bone) with or without surgery, it is ok to use narcotic pain medications to help control one's pain.  We understand that everyone responds to pain differently and each individual patient will be evaluated on a regular basis for the continued need for narcotic medications. Ideally, narcotic medication use should last no more than 6-8 weeks (coinciding with fracture  healing).   As a patient it is your responsibility as well to monitor narcotic medication use and report the amount and frequency you use these medications when you come to your office visit.   We would also advise that if you are using narcotic medications, you should take a dose prior to therapy to maximize you participation.  IF YOU ARE ON NARCOTIC MEDICATIONS IT IS NOT PERMISSIBLE TO OPERATE A MOTOR VEHICLE (MOTORCYCLE/CAR/TRUCK/MOPED) OR HEAVY MACHINERY DO NOT MIX NARCOTICS WITH OTHER CNS (CENTRAL NERVOUS SYSTEM) DEPRESSANTS SUCH AS ALCOHOL   STOP SMOKING OR USING NICOTINE PRODUCTS!!!!  As discussed nicotine severely impairs your body's ability to heal surgical and traumatic wounds but also impairs bone healing.  Wounds and bone heal by forming microscopic blood vessels (angiogenesis) and nicotine is a vasoconstrictor (essentially, shrinks blood vessels).  Therefore, if vasoconstriction occurs to these microscopic blood vessels they essentially disappear and are unable to deliver necessary nutrients to the healing tissue.  This is one modifiable factor that you can do to dramatically increase your chances of healing your injury.    (This means no smoking, no nicotine gum, patches, etc)  DO NOT USE NONSTEROIDAL ANTI-INFLAMMATORY DRUGS (NSAID'S)  Using products such as Advil (ibuprofen), Aleve (naproxen), Motrin (ibuprofen) for additional pain control during fracture healing can delay and/or prevent the healing response.  If you would like to take over the counter (OTC) medication, Tylenol (acetaminophen) is ok.  However, some narcotic medications that are given for pain control contain acetaminophen as well. Therefore, you should not exceed more than 4000 mg of tylenol in a  day if you do not have liver disease.  Also note that there are may OTC medicines, such as cold medicines and allergy medicines that my contain tylenol as well.  If you have any questions about medications and/or interactions  please ask your doctor/PA or your pharmacist.      ICE AND ELEVATE INJURED/OPERATIVE EXTREMITY  Using ice and elevating the injured extremity above your heart can help with swelling and pain control.  Icing in a pulsatile fashion, such as 20 minutes on and 20 minutes off, can be followed.    Do not place ice directly on skin. Make sure there is a barrier between to skin and the ice pack.    Using frozen items such as frozen peas works well as the conform nicely to the are that needs to be iced.  USE AN ACE WRAP OR TED HOSE FOR SWELLING CONTROL  In addition to icing and elevation, Ace wraps or TED hose are used to help limit and resolve swelling.  It is recommended to use Ace wraps or TED hose until you are informed to stop.    When using Ace Wraps start the wrapping distally (farthest away from the body) and wrap proximally (closer to the body)   Example: If you had surgery on your leg or thing and you do not have a splint on, start the ace wrap at the toes and work your way up to the thigh        If you had surgery on your upper extremity and do not have a splint on, start the ace wrap at your fingers and work your way up to the upper arm  IF YOU ARE IN A SPLINT OR CAST DO NOT REMOVE IT FOR ANY REASON   If your splint gets wet for any reason please contact the office immediately. You may shower in your splint or cast as long as you keep it dry.  This can be done by wrapping in a cast cover or garbage back (or similar)  Do Not stick any thing down your splint or cast such as pencils, money, or hangers to try and scratch yourself with.  If you feel itchy take benadryl as prescribed on the bottle for itching  IF YOU ARE IN A CAM BOOT (BLACK BOOT)  You may remove boot periodically. Perform daily dressing changes as noted below.  Wash the liner of the boot regularly and wear a sock when wearing the boot. It is recommended that you sleep in the boot until told otherwise  CALL THE OFFICE WITH ANY  QUESTIONS OR CONCERNS: [phone]

## 2018-11-13 NOTE — Anesthesia Preprocedure Evaluation (Signed)
Anesthesia Evaluation  Patient identified by MRN, date of birth, ID band Patient awake    Reviewed: Allergy & Precautions, NPO status , Patient's Chart, lab work & pertinent test results  Airway Mallampati: I  TM Distance: >3 FB Neck ROM: Full    Dental no notable dental hx. (+) Teeth Intact   Pulmonary former smoker,    Pulmonary exam normal breath sounds clear to auscultation       Cardiovascular negative cardio ROS Normal cardiovascular exam Rhythm:Regular Rate:Normal     Neuro/Psych Seizures -, Well Controlled,  Anxiety Hx/o febrile Sz as child    GI/Hepatic negative GI ROS, Neg liver ROS,   Endo/Other  negative endocrine ROS  Renal/GU negative Renal ROS  negative genitourinary   Musculoskeletal Painful hardware left femur    Abdominal   Peds  Hematology  (+) anemia ,   Anesthesia Other Findings   Reproductive/Obstetrics negative OB ROS                             Anesthesia Physical Anesthesia Plan  ASA: II  Anesthesia Plan: General   Post-op Pain Management:    Induction: Intravenous  PONV Risk Score and Plan: Treatment may vary due to age or medical condition, Ondansetron, Dexamethasone and Midazolam  Airway Management Planned: LMA  Additional Equipment:   Intra-op Plan:   Post-operative Plan: Extubation in OR  Informed Consent: I have reviewed the patients History and Physical, chart, labs and discussed the procedure including the risks, benefits and alternatives for the proposed anesthesia with the patient or authorized representative who has indicated his/her understanding and acceptance.   Dental advisory given  Plan Discussed with: CRNA and Surgeon  Anesthesia Plan Comments:         Anesthesia Quick Evaluation

## 2018-11-13 NOTE — Op Note (Signed)
11/13/2018  2:49 PM  PATIENT:  [name]  27 y.o. female  PRE-OPERATIVE DIAGNOSIS:  symptomatic hardware left distal femur  POST-OPERATIVE DIAGNOSIS:  symptomatic retained hardware left femur  PROCEDURE:  Procedure(s): HARDWARE REMOVAL (Left)  SURGEON:  Surgeon(s) and Role:    Myrene Galas* Mckynlie Vanderslice, MD - Primary  PHYSICIAN ASSISTANT: Montez MoritaKEITH PAUL, PA-C  ASSISTANTS: 2. PA Student   ANESTHESIA:   general  EBL:  50 mL   BLOOD ADMINISTERED:none  DRAINS: none   LOCAL MEDICATIONS USED:  NONE  SPECIMEN:  No Specimen  DISPOSITION OF SPECIMEN:  N/A  COUNTS:  YES  TOURNIQUET:  * No tourniquets in log *  DICTATION: .Note written in EPIC  PLAN OF CARE: Discharge to home after PACU  PATIENT DISPOSITION:  PACU - hemodynamically stable.   Delay start of Pharmacological VTE agent (>24hrs) due to surgical blood loss or risk of bleeding: no  BRIEF SUMMARY OF INDICATION FOR PROCEDURE:  Patient is a pleasant 27 y.o. who underwent plate fixation of a left femur fracture with subsequent healing. Despite conservative measures, hardware related symptoms have persisted. The patient's young age also predisposes to bone overgrowth that could significantly complicate or prevent subsequent removal. Therefore, I discussed with the parents and patient the risks and benefits of surgical removal including infection, nerve or vessel injury, failure to alleviate symptoms, occult nonunion, re-fracture, DVT, PE, and multiple others. They did wish to proceed.  BRIEF SUMMARY OF PROCEDURE:  The patient was taken to the operating room after administration of 2 g of Ancef.  General anesthesia was induced. The left lower extremity was prepped and draped in usual sterile fashion.  No tourniquet was used during the procedure.  C-arm was brought in to confirm position of the hardware.  I remade the old distal incision and dissected sharply down to the plate, elevating the soft tissues. I identified and removed  all screws and locking caps. With x-ray confirmation, I then remade just two of the four proximal incisions.  I placed a tonsil clamp into the head of each screw to facilitate engagement of the screwdriver. Each screw was removed atraumatically and without difficulty. A Cobb over top of the plate and underneath with the sharp edge away from the periosteum to generate some mobility.  The plate was gently rocked to assist with this and then extracted atraumatically.  Final x-rays confirmed removal of all hardware and a healed fracture. The wounds were irrigated thoroughly and closed in standard fashion with vicryl and nylon. A sterile gently compressive dressing was applied.  The patient was taken to the PACU in stable condition.  Montez MoritaKeith Paul, PA-C, assisted me throughout as did a second assist.  PROGNOSIS: Patient will be weightbearing as tolerated with aggressive active and passive motion of the knee and ankle. Bleeding would be anticipated. She may change or remove his dressing in 48 hours and shower. Patient will follow up in 10 days for removal of sutures.    Doralee AlbinoMichael H. Carola FrostHandy, M.D.

## 2018-11-13 NOTE — Anesthesia Procedure Notes (Signed)
Procedure Name: LMA Insertion Date/Time: 11/13/2018 12:40 PM Performed by: Zollie ScaleKillmon, Sharine Cadle D, CRNA Pre-anesthesia Checklist: Patient identified, Emergency Drugs available, Suction available and Patient being monitored Patient Re-evaluated:Patient Re-evaluated prior to induction Oxygen Delivery Method: Circle System Utilized Preoxygenation: Pre-oxygenation with 100% oxygen Induction Type: IV induction Ventilation: Mask ventilation without difficulty LMA: LMA inserted LMA Size: 4.0 Number of attempts: 1 Airway Equipment and Method: Bite block Placement Confirmation: positive ETCO2 Tube secured with: Tape Dental Injury: Teeth and Oropharynx as per pre-operative assessment

## 2018-11-14 ENCOUNTER — Encounter (HOSPITAL_COMMUNITY): Payer: Self-pay | Admitting: Orthopedic Surgery

## 2019-06-02 ENCOUNTER — Telehealth: Payer: Self-pay | Admitting: Family

## 2019-06-02 IMAGING — RF DG FEMUR 2+V*L*
1 series · 4 of 4 positions shown · non-contrast
Comparison: 12/23/2017.

CLINICAL DATA: Left femur hardware removal.

EXAM:
DG C-ARM 61-120 MIN; LEFT FEMUR 2 VIEWS

[Series 1: run · 4 of 4 slices shown]
[im 1/4]
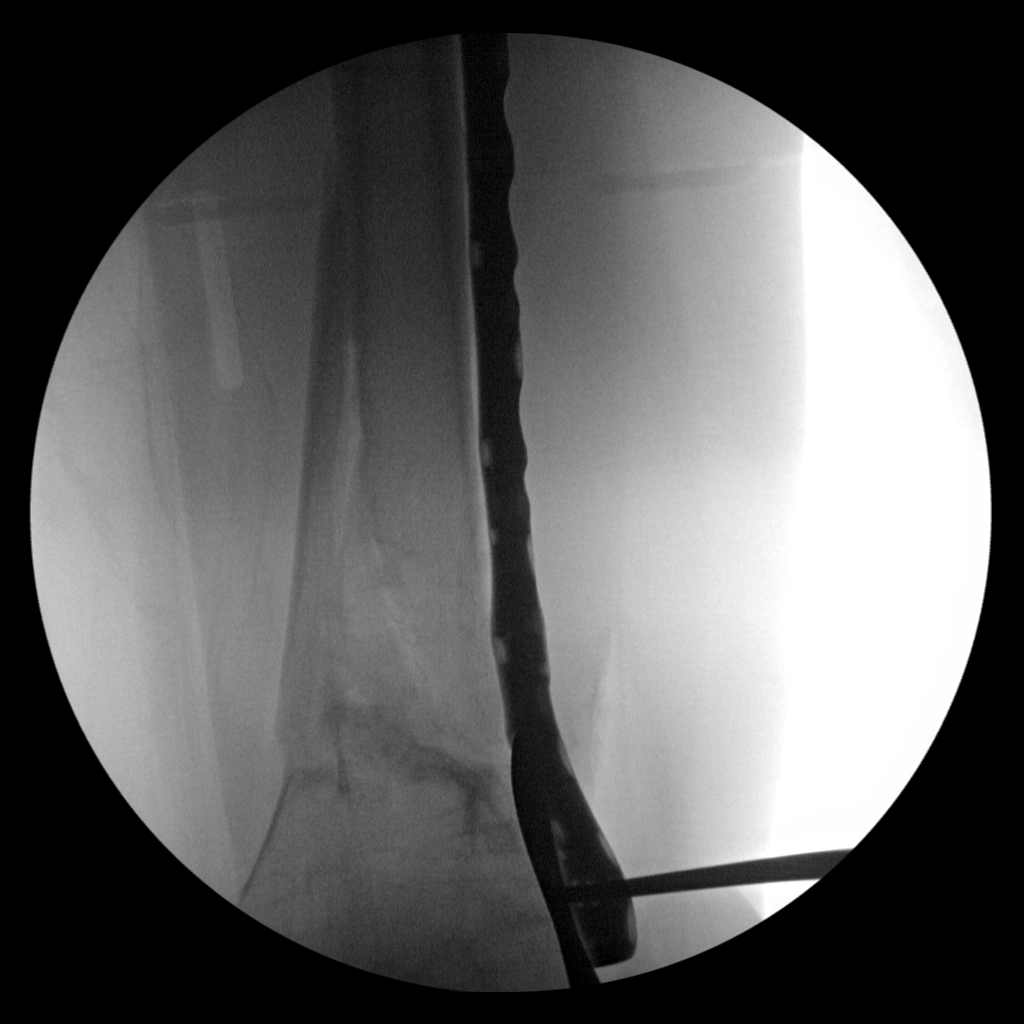
[im 2/4]
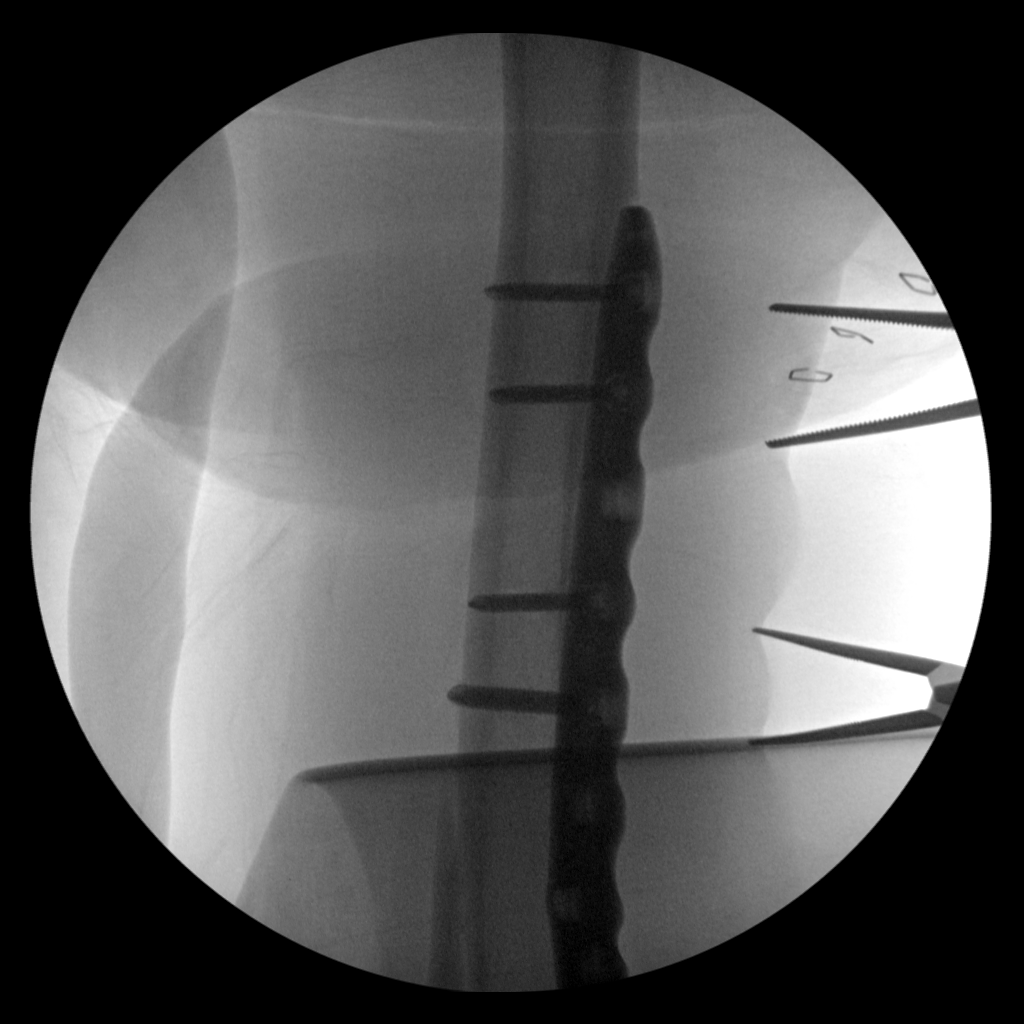
[im 3/4]
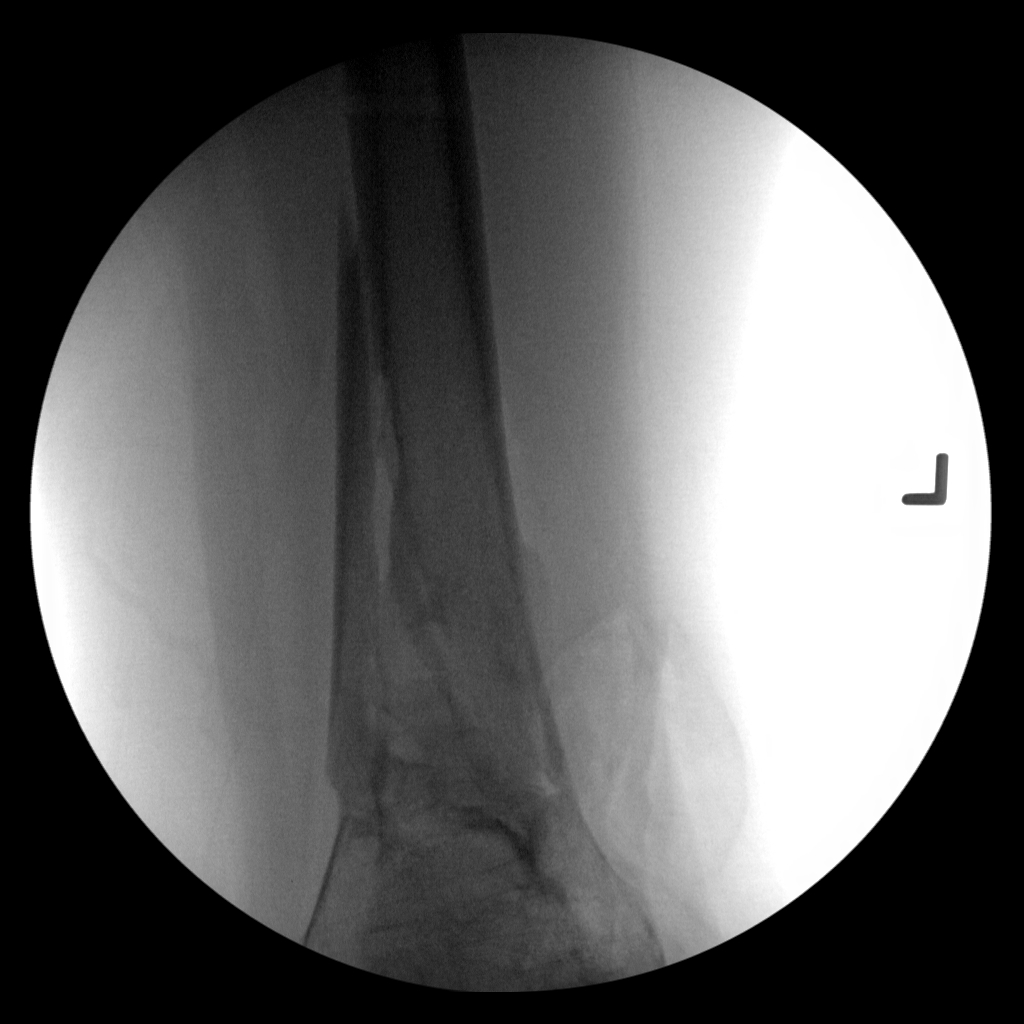
[im 4/4]
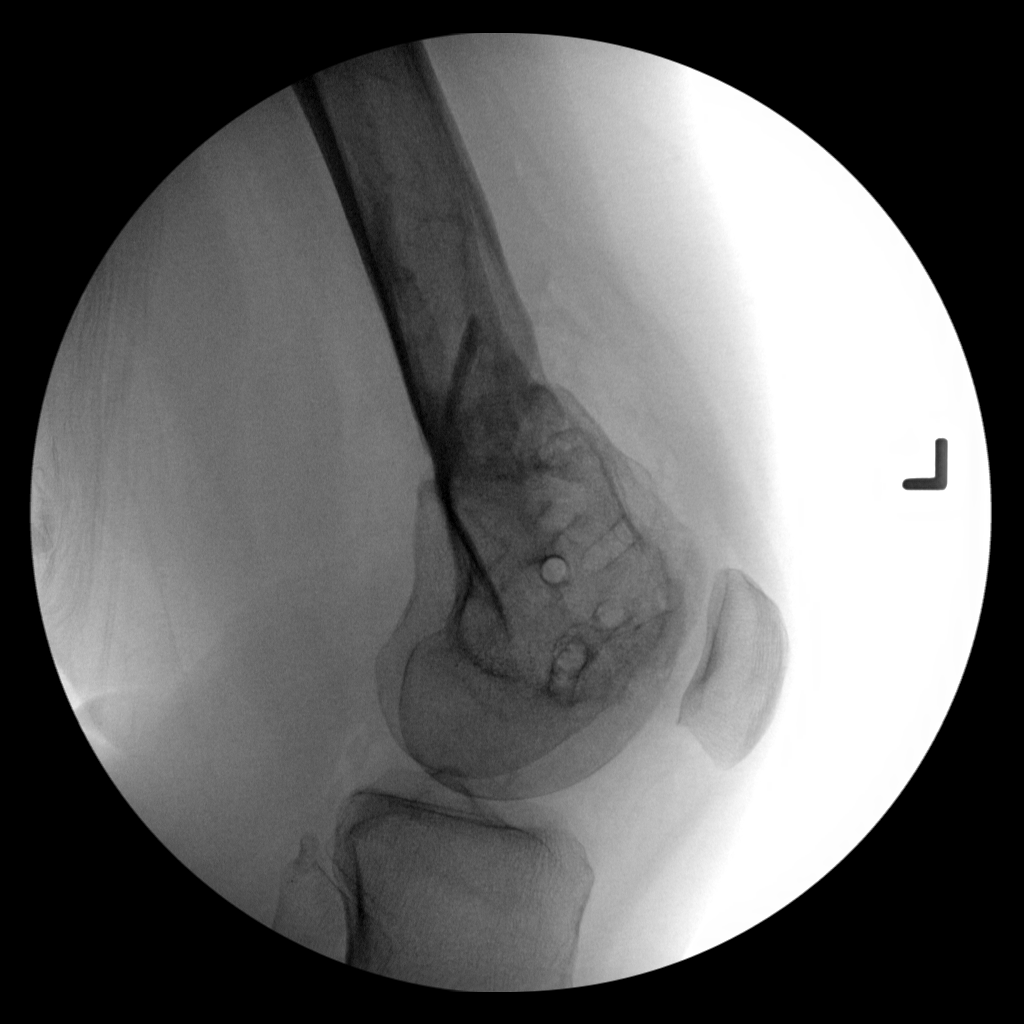

[4 of 4 positions shown; findings below may reference images not displayed]

FINDINGS: Postsurgical changes noted with interim removal of left femoral
hardware. Stable left femoral alignment.
IMPRESSION: Interim removal of left femoral hardware. Stable left femoral
alignment.

## 2019-10-13 ENCOUNTER — Other Ambulatory Visit: Payer: Self-pay | Admitting: *Deleted

## 2019-10-13 DIAGNOSIS — Z20822 Contact with and (suspected) exposure to covid-19: Secondary | ICD-10-CM

## 2019-10-14 LAB — NOVEL CORONAVIRUS, NAA: SARS-CoV-2, NAA: NOT DETECTED

## 2019-12-30 ENCOUNTER — Other Ambulatory Visit: Payer: Self-pay

## 2019-12-30 ENCOUNTER — Encounter (HOSPITAL_COMMUNITY): Payer: Self-pay

## 2019-12-30 ENCOUNTER — Emergency Department (HOSPITAL_COMMUNITY)
Admission: EM | Admit: 2019-12-30 | Discharge: 2019-12-30 | Disposition: A | Discharge status: Home / Self Care | Payer: Medicaid Other | Attending: Emergency Medicine | Admitting: Emergency Medicine

## 2019-12-30 DIAGNOSIS — G40909 Epilepsy, unspecified, not intractable, without status epilepticus: Secondary | ICD-10-CM | POA: Insufficient documentation

## 2019-12-30 DIAGNOSIS — Z793 Long term (current) use of hormonal contraceptives: Secondary | ICD-10-CM | POA: Insufficient documentation

## 2019-12-30 DIAGNOSIS — Z87891 Personal history of nicotine dependence: Secondary | ICD-10-CM | POA: Insufficient documentation

## 2019-12-30 DIAGNOSIS — Z79899 Other long term (current) drug therapy: Secondary | ICD-10-CM | POA: Insufficient documentation

## 2019-12-30 DIAGNOSIS — H66011 Acute suppurative otitis media with spontaneous rupture of ear drum, right ear: Secondary | ICD-10-CM

## 2019-12-30 MED ORDER — AMOXICILLIN 500 MG PO CAPS: 500.0000 mg | ORAL_CAPSULE | Freq: Three times a day (TID) | ORAL | 0 refills | Status: AC

## 2019-12-30 MED ORDER — FLUTICASONE PROPIONATE 50 MCG/ACT NA SUSP: 2.0000 | Freq: Every day | NASAL | 2 refills | Status: AC

## 2019-12-30 MED ORDER — CETIRIZINE HCL 10 MG PO TABS: 10.0000 mg | ORAL_TABLET | Freq: Two times a day (BID) | ORAL | 1 refills | Status: AC

## 2019-12-30 NOTE — ED Provider Notes (Signed)
[facility] EMERGENCY DEPARTMENT Provider Note   CSN: [id number] Arrival date & time: 12/30/19  1007     History Chief Complaint  Patient presents with  . Otalgia    [name] is a 29 y.o. female with a hx of TBI presents to the Emergency Department complaining of gradual, persistent, progressively worsening right otalgia onset 3 days ago.  Pt reports last week she had nasal congestion and clear nasal drainage.  She reports developing right otalgia over the last few days.  Denies fever, chills, throat pain/swelling. She does reports some lymphadenopathy and purulent drainage from the ear this morning.  No headache, neck pain, neck stiffness.  No treatments PTA.    The history is provided by the patient and medical records. No language interpreter was used.       Past Medical History:  Diagnosis Date  . Anxiety   . Multiple closed fractures of left foot 12/24/2017  . Multiple unstable closed lateral compression fractures of pelvis ([place]), LC 3 pelvic ring fracture  12/24/2017  . 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 Two episodes last year-- last one was ~ 10/2017" (12/25/2017)    Patient Active Problem List   Diagnosis Date Noted  . E-coli UTI   . Hypoalbuminemia due to protein-calorie malnutrition ([place])   . Acute lower UTI   . Transaminitis   . Acute blood loss anemia   . Trauma 12/27/2017  . Status post closed fracture of left femur 12/27/2017  . Mild TBI ([place]) 12/27/2017  . History of pelvic fracture 12/27/2017  . Multiple unstable closed lateral compression fractures of pelvis ([place]), LC 3 pelvic ring fracture  12/24/2017  . Multiple closed fractures of left foot 12/24/2017  . Laceration with foreign body, left thigh, initial encounter 12/22/2017  . Closed fracture of left distal femur ([place]) 12/21/2017    Past Surgical History:  Procedure Laterality Date  . DILATION AND CURETTAGE OF UTERUS    . EXTERNAL FIXATION LEG Left 12/22/2017   Procedure: EXTERNAL FIXATION LEG, LEFT KNEE, WOUND DEBRIDEMENT LEFT THIGH WITH VAC APPLICATION;  Surgeon: Nicholes Stairs, MD;  Location: [place];  Service: Orthopedics;  Laterality: Left;  . FRACTURE SURGERY    . HARDWARE REMOVAL Left 11/13/2018   Procedure: HARDWARE REMOVAL;  Surgeon: Altamese Onamia, MD;  Location: [place];  Service: Orthopedics;  Laterality: Left;  . I & D EXTREMITY Left 12/23/2017   Procedure: IRRIGATION AND DEBRIDEMENT LEFT THIGH WOUND AND CLOSURE;  Surgeon: Altamese Hawk Run, MD;  Location: [place];  Service: Orthopedics;  Laterality: Left;  . KNEE CLOSED REDUCTION Left 01/10/2018   Procedure: CLOSED MANIPULATION KNEE;  Surgeon: Altamese Meagher, MD;  Location: [place];  Service: Orthopedics;  Laterality: Left;     OB History   No obstetric history on file.     Family History  Problem Relation Age of Onset  . Atrial fibrillation Mother        resolved with Cardizem/ history of trigeminey/PVCs.   Marland Kitchen Hypertension Father     Social History   Tobacco Use  . Smoking status: Former Smoker    Packs/day: 0.50    Years: 6.00    Pack years: 3.00    Types: Cigarettes    Quit date: 12/21/2017    Years since quitting: 2.0  . Smokeless tobacco: Never Used  Substance Use Topics  . Alcohol use: No  . Drug use: Not Currently    Types: Marijuana    Home Medications Prior  to Admission medications   Medication Sig Start Date End Date Taking? Authorizing Provider  amoxicillin (AMOXIL) 500 MG capsule Take 1 capsule (500 mg total) by mouth 3 (three) times daily. 12/30/19   Keniyah Gelinas, Dahlia Client, PA-C  cetirizine (ZYRTEC ALLERGY) 10 MG tablet Take 1 tablet (10 mg total) by mouth 2 (two) times daily. 12/30/19   Keri Tavella, Dahlia Client, PA-C  escitalopram (LEXAPRO) 10 MG tablet Take 10 mg by mouth daily.    [provider]  fluticasone (FLONASE) 50 MCG/ACT nasal spray Place 2 sprays into both nostrils daily. 12/30/19   Nykeria Mealing, Dahlia Client, PA-C  HYDROcodone-acetaminophen (NORCO)  5-325 MG tablet Take 1-2 tablets by mouth every 8 (eight) hours as needed for moderate pain or severe pain. 11/13/18   Montez Morita, PA-C  ketorolac (TORADOL) 10 MG tablet Take 1 tablet (10 mg total) by mouth every 6 (six) hours as needed for moderate pain. 11/13/18   Montez Morita, PA-C  levonorgestrel-ethinyl estradiol (AVIANE,ALESSE,LESSINA) 0.1-20 MG-MCG tablet Take 1 tablet by mouth daily. 10/19/18   [provider]  Multiple Vitamin (MULTIVITAMIN WITH MINERALS) TABS tablet Take 1 tablet by mouth daily. 01/04/18   Love, Evlyn Kanner, PA-C  ondansetron (ZOFRAN ODT) 4 MG disintegrating tablet Take 1 tablet (4 mg total) by mouth every 8 (eight) hours as needed for nausea or vomiting. 11/13/18   Montez Morita, PA-C    Allergies    Patient has no known allergies.  Review of Systems   Review of Systems  Constitutional: Negative for chills and fever.  HENT: Positive for congestion, ear pain, rhinorrhea and sinus pressure. Negative for facial swelling and sore throat.   Respiratory: Negative for cough.   Gastrointestinal: Negative for nausea and vomiting.  Musculoskeletal: Negative for neck pain and neck stiffness.  Skin: Negative for color change.  Hematological: Positive for adenopathy.    Physical Exam Updated Vital Signs BP (!) 143/90 (BP Location: Right Arm)   Pulse 61   Temp 98.5 F (36.9 C) (Oral)   Resp 18   Ht 5\' 3"  (1.6 m)   Wt 59 kg   SpO2 99%   BMI 23.03 kg/m   Physical Exam Vitals and nursing note reviewed.  Constitutional:      General: She is not in acute distress.    Appearance: She is well-developed.  HENT:     Head: Normocephalic.     Right Ear: Drainage present. A middle ear effusion is present. Tympanic membrane is perforated, erythematous and bulging.     Left Ear: Tympanic membrane and ear canal normal.     Ears:     Comments: Small amount of purulent drainage noted in the canal.  Erythematous and bulging tympanic membrane with very small perforation.     Nose: Congestion and rhinorrhea present. Rhinorrhea is clear.     Right Nostril: No epistaxis.     Left Nostril: No epistaxis.     Right Turbinates: Enlarged.     Left Turbinates: Enlarged.     Right Sinus: No maxillary sinus tenderness or frontal sinus tenderness.     Left Sinus: No maxillary sinus tenderness or frontal sinus tenderness.     Mouth/Throat:     Lips: Pink.     Mouth: Mucous membranes are moist.     Tongue: No lesions.     Pharynx: Oropharynx is clear. No pharyngeal swelling, oropharyngeal exudate, posterior oropharyngeal erythema or uvula swelling.  Eyes:     General: No scleral icterus.    Extraocular Movements: Extraocular movements intact.  Conjunctiva/sclera: Conjunctivae normal.  Cardiovascular:     Rate and Rhythm: Normal rate and regular rhythm.     Pulses:          Radial pulses are 2+ on the right side and 2+ on the left side.  Pulmonary:     Effort: Pulmonary effort is normal. No tachypnea or accessory muscle usage.     Comments: No increased work of breathing Abdominal:     General: There is no distension.  Musculoskeletal:        General: Normal range of motion.     Cervical back: Normal range of motion. No rigidity. No pain with movement.  Lymphadenopathy:     Head:     Right side of head: Preauricular and posterior auricular adenopathy present. No submental, submandibular, tonsillar or occipital adenopathy.     Left side of head: No submental, submandibular, tonsillar, preauricular, posterior auricular or occipital adenopathy.     Cervical: No cervical adenopathy.  Skin:    General: Skin is warm and dry.  Neurological:     Mental Status: She is alert.     ED Results / Procedures / Treatments    Procedures Procedures (including critical care time)  Medications Ordered in ED Medications - No data to display  ED Course  I have reviewed the triage vital signs and the nursing notes.  Pertinent labs & imaging results that were available  during my care of the patient were reviewed by me and considered in my medical decision making (see chart for details).    MDM Rules/Calculators/A&P                      Patient presents with otalgia and exam consistent with acute otitis media. No concern for acute mastoiditis, meningitis.  No antibiotic use in the last month.  Patient discharged home with Amoxicillin.  We will also give Zyrtec and Flonase for nasal congestion.  Patient will have close primary care follow-up for recheck of tympanic membrane in approximately 1 week.  Discussed reasons to return to the emergency department.    Final Clinical Impression(s) / ED Diagnoses Final diagnoses:  Acute suppurative otitis media of right ear with spontaneous rupture of tympanic membrane, recurrence not specified    Rx / DC Orders ED Discharge Orders         Ordered    amoxicillin (AMOXIL) 500 MG capsule  3 times daily     12/30/19 1051    fluticasone (FLONASE) 50 MCG/ACT nasal spray  Daily     12/30/19 1051    cetirizine (ZYRTEC ALLERGY) 10 MG tablet  2 times daily     12/30/19 1051           Brandi Armato, Boyd Kerbs 12/30/19 1052    Bethann Berkshire, MD 01/01/20 1038

## 2019-12-30 NOTE — ED Triage Notes (Signed)
Pt c/o pain in r ear for past few days.  Reports noticed drainage Sunday and says now the r side of her head feels "full."  Also reports tenderness in lymph nodes on r side.  Took tylenol at 0730 this morning.

## 2019-12-30 NOTE — Discharge Instructions (Signed)
1. Medications: Amoxicillin, Flonase, Zyrtec, usual home medications 2. Treatment: rest, drink plenty of fluids, Tylenol or ibuprofen for fever or pain 3. Follow Up: Please followup with your primary doctor in 1 week for reevaluation of your ear infection. Please return to the ER for worsening symptoms, high fevers, persistent or bloody drainage from the ear or other concerns

## 2020-06-19 ENCOUNTER — Emergency Department (HOSPITAL_COMMUNITY)
Admission: EM | Admit: 2020-06-19 | Discharge: 2020-06-20 | Disposition: A | Discharge status: Home / Self Care | Payer: Medicaid Other | Attending: Emergency Medicine | Admitting: Emergency Medicine

## 2020-06-19 ENCOUNTER — Encounter (HOSPITAL_COMMUNITY): Payer: Self-pay | Admitting: Emergency Medicine

## 2020-06-19 ENCOUNTER — Other Ambulatory Visit: Payer: Self-pay

## 2020-06-19 DIAGNOSIS — F329 Major depressive disorder, single episode, unspecified: Secondary | ICD-10-CM | POA: Insufficient documentation

## 2020-06-19 DIAGNOSIS — IMO0002 Reserved for concepts with insufficient information to code with codable children: Secondary | ICD-10-CM

## 2020-06-19 DIAGNOSIS — X788XXA Intentional self-harm by other sharp object, initial encounter: Secondary | ICD-10-CM | POA: Insufficient documentation

## 2020-06-19 DIAGNOSIS — Z87891 Personal history of nicotine dependence: Secondary | ICD-10-CM | POA: Insufficient documentation

## 2020-06-19 LAB — COMPREHENSIVE METABOLIC PANEL
ALT: 23 U/L (ref 0–44)
AST: 37 U/L (ref 15–41)
Albumin: 5 g/dL (ref 3.5–5.0)
Alkaline Phosphatase: 76 U/L (ref 38–126)
Anion gap: 11 (ref 5–15)
BUN: 9 mg/dL (ref 6–20)
CO2: 23 mmol/L (ref 22–32)
Calcium: 9.2 mg/dL (ref 8.9–10.3)
Chloride: 106 mmol/L (ref 98–111)
Creatinine, Ser: 0.66 mg/dL (ref 0.44–1.00)
GFR calc Af Amer: >60 mL/min (ref >60)
GFR calc non Af Amer: >60 mL/min (ref >60)
Glucose, Bld: 93 mg/dL (ref 70–99)
Potassium: 4.1 mmol/L (ref 3.5–5.1)
Sodium: 140 mmol/L (ref 135–145)
Total Bilirubin: 0.3 mg/dL (ref 0.3–1.2)
Total Protein: 8.9 g/dL — ABNORMAL HIGH (ref 6.5–8.1)

## 2020-06-19 LAB — CBC
HCT: 43.9 % (ref 36.0–46.0)
Hemoglobin: 14.9 g/dL (ref 12.0–15.0)
MCH: 31.7 pg (ref 26.0–34.0)
MCHC: 33.9 g/dL (ref 30.0–36.0)
MCV: 93.4 fL (ref 80.0–100.0)
Platelets: 290 10*3/uL (ref 150–400)
RBC: 4.7 MIL/uL (ref 3.87–5.11)
RDW: 13.2 % (ref 11.5–15.5)
WBC: 5.9 10*3/uL (ref 4.0–10.5)
nRBC: 0 % (ref 0.0–0.2)

## 2020-06-19 LAB — SALICYLATE LEVEL: Salicylate Lvl: <7 mg/dL — ABNORMAL LOW (ref 7.0–30.0)

## 2020-06-19 LAB — RAPID URINE DRUG SCREEN, HOSP PERFORMED
Amphetamines: NOT DETECTED
Barbiturates: NOT DETECTED
Benzodiazepines: NOT DETECTED
Cocaine: NOT DETECTED
Opiates: NOT DETECTED
Tetrahydrocannabinol: POSITIVE — AB

## 2020-06-19 LAB — ACETAMINOPHEN LEVEL: Acetaminophen (Tylenol), Serum: <10 ug/mL — ABNORMAL LOW (ref 10–30)

## 2020-06-19 LAB — ETHANOL: Alcohol, Ethyl (B): 227 mg/dL — ABNORMAL HIGH (ref <10)

## 2020-06-19 LAB — PREGNANCY, URINE: Preg Test, Ur: NEGATIVE

## 2020-06-19 NOTE — ED Triage Notes (Signed)
Pt here for suicidial ideation for several years. Tonight pt admits to taking a razor blade and cutting on RIGHT forearm. Pt admits to ETOH tonight as well.

## 2020-06-19 NOTE — ED Notes (Signed)
Pt has superficial, self inflicted  cuts to right arm.

## 2020-06-19 NOTE — ED Notes (Signed)
Pt also endorses not having any family support and relationship support at this time and is currently taking Lexapro in evenings for depression.

## 2020-06-19 NOTE — ED Notes (Addendum)
Pt endorses previous Hx of SI and acts of self-mutilation. Pt reports if she was serious about killing herself though, she would shoot herself with a gun, but currently does not have access to a firearm. Pt does present with linear, superficial cuts from a razor on anterior right forearm. Bleeding controled at this time. Pt belongings gathered and secured in locker. Pt wanded by security and wearing maroon scrubs.

## 2020-06-20 MED ORDER — NICOTINE 21 MG/24HR TD PT24
21.0000 mg | MEDICATED_PATCH | Freq: Once | TRANSDERMAL | Status: DC
  Administered 2020-06-20: 21 mg via TRANSDERMAL
  Filled 2020-06-20: qty 1

## 2020-06-20 MED ORDER — ESCITALOPRAM OXALATE 10 MG PO TABS
10.0000 mg | ORAL_TABLET | Freq: Every day | ORAL | Status: DC
  Administered 2020-06-20: 10 mg via ORAL
  Filled 2020-06-20: qty 1

## 2020-06-20 NOTE — ED Notes (Signed)
[facility] called to report recommendation is to hold Pt overnight for re-evaluation with TTS in the morning. MD Notified.

## 2020-06-20 NOTE — Discharge Instructions (Addendum)
Follow-up as per behavioral health.  Cleared by behavioral health for discharge home. 

## 2020-06-20 NOTE — ED Notes (Signed)
Pt came out to use the restroom

## 2020-06-20 NOTE — ED Notes (Signed)
Pt is using phone at the moment on the phone with family.introduce myself.. Pt is calm and cooperative.

## 2020-06-20 NOTE — ED Notes (Addendum)
Pt's boyfriend is visiting. Lunch tray was given as well as breakfast

## 2020-06-20 NOTE — ED Notes (Signed)
Pt is using the phone at the time

## 2020-06-20 NOTE — ED Provider Notes (Signed)
Patient cleared by behavioral health for discharge home.   Vanetta Mulders, MD 06/20/20 2024

## 2020-06-20 NOTE — BH Assessment (Addendum)
Clinician made contact w/ pt to determine if she meets inpatient criteria or if she can be discharged home and then reviewed the information with Nira Conn, NP. Pt shares she slept well last night and that she feels much better today. She states she had been at the hospital long enough that she was served all three meals, which she ate. She states she has been in contact with her boyfriend and that he came to visit for every visitation the hospital had; pt gave clinician verbal consent to contact her boyfriend for collateral information (see below).  Pt states, "Last night we went out and I had a little too much to drink and I usually don't drink and I think that played into it. Now I want to go home. I had a razor--I was distraught. I don't do that normally. I was upset--I was looking at pictures of my grandma who just passed away in Jan 22, 2023. We were real close. My boyfriend called an ambulance because he'd never seen me do that before."   Pt shares she's had a therapist in the past and that there's a new therapist that just started at her PCP's office; she states she believes it would be beneficial for her to start seeing the new therapist and that seeing the therapist at her PCP's office would be convenient. Pt states she is currently on Lexapro, which is prescribed through her PCP, and she believes it is working well.  Pt denies SI, HI, AVH, any NSSIB with the exception of what occurred last night, access to guns/weapons, any engagement with the legal system, seldom EtOH use and some marijuana use.  Clinician contact pt's boyfriend, Kathryne Eriksson, who stated, "The only thing is I think she drank a little bit too much; it was just that she drank too much liquor." Pt's boyfriend denied concerns that pt is a risk to herself or to others, stating, "she doesn't normally drink--she needs to stay away from liquor."  Nira Conn, NP, reviewed pt's chart and information and determined pt does not meet  inpatient criteria and can be psych cleared. This information was provided to pt's nurse, Marsh Dolly, at 2021 and her provider, Dr. Deretha Emory, at 2023.  Kathryne Eriksson [phone]

## 2020-06-20 NOTE — ED Notes (Signed)
Sam with [facility] called and notified staff that pt was psych cleared.

## 2020-06-20 NOTE — ED Notes (Signed)
Pt is using the phone to call her work.

## 2020-06-20 NOTE — ED Notes (Signed)
Pt's fiance at the bedside

## 2020-06-20 NOTE — BH Assessment (Signed)
CSW contacted patient on the unit to obtain consent to contact the patient's mother.   Patient declined consent for anyone to speak with her mother at this time.     TTS/Disposition will continue to follow.   Baldo Daub, MSW, LCSW Clinical Social Worker [facility]

## 2020-06-20 NOTE — ED Notes (Signed)
Pt went to use the restroom. 
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
## 2020-06-20 NOTE — ED Notes (Signed)
Pt went to bathroom, and brushed teeth.

## 2020-06-20 NOTE — ED Notes (Addendum)
Pt's boyfriend  is visiting with pt.

## 2020-06-20 NOTE — ED Notes (Signed)
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

## 2020-06-20 NOTE — Progress Notes (Signed)
Pt tele psych consult completed.

## 2020-06-20 NOTE — ED Provider Notes (Signed)
[facility] EMERGENCY DEPARTMENT Provider Note   CSN: [id number] Arrival date & time: 06/19/20  2136   Time seen 12:45 AM  History Chief Complaint  Patient presents with  . Suicidal    [name] is a 29 y.o. female.  HPI   Patient states she has had depression in the past and she is being followed by Dr. Antony Odea in [place]. She has been on Lexapro for a year and she states it is hard to tell if it is helping or not. She states she has been cutting herself for a long time, she does it at least monthly. She states tonight she got caught. She states when she was cutting herself tonight she was wanting everyone to leave her alone. When I asked her if she was trying to harm her self or kill her self she states "I do not think so". At some point she states it was "a big joke", because the police were there but then she could not tell me what she was talking about. When I asked her what she felt about what she did tonight she states "I might do it again". She states she has never been admitted to psychiatric hospital. She states she has been treated for depression before. She is not sure if her psychiatrist knows that she cuts herself. She states she was living by herself but three or 4 months ago she moved in with her boyfriend. She states she works at [employer].  Patient is here voluntarily.  PCP none Psychiatrist Gennette Pac, NP   Past Medical History:  Diagnosis Date  . Anxiety   . Multiple closed fractures of left foot 12/24/2017  . Multiple unstable closed lateral compression fractures of pelvis (HCC), LC 3 pelvic ring fracture  12/24/2017  . Seizures (HCC)    "Epileptic; stress related v/s syncopal epidsode. Two episodes last year-- last one was ~ 10/2017" (12/25/2017)    Patient Active Problem List   Diagnosis Date Noted  . E-coli UTI   . Hypoalbuminemia due to protein-calorie malnutrition (HCC)   . Acute lower UTI   . Transaminitis   . Acute blood loss anemia   .  Trauma 12/27/2017  . Status post closed fracture of left femur 12/27/2017  . 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 History of pelvic fracture 12/27/2017  . Multiple unstable closed lateral compression fractures of pelvis (HCC), LC 3 pelvic ring fracture  12/24/2017  . Multiple closed fractures of left foot 12/24/2017  . Laceration with foreign body, left thigh, initial encounter 12/22/2017  . Closed fracture of left distal femur (HCC) 12/21/2017    Past Surgical History:  Procedure Laterality Date  . DILATION AND CURETTAGE OF UTERUS    . EXTERNAL FIXATION LEG Left 12/22/2017   Procedure: EXTERNAL FIXATION LEG, LEFT KNEE, WOUND DEBRIDEMENT LEFT THIGH WITH VAC APPLICATION;  Surgeon: Yolonda Kida, MD;  Location: [facility] OR;  Service: Orthopedics;  Laterality: Left;  . FRACTURE SURGERY    . HARDWARE REMOVAL Left 11/13/2018   Procedure: HARDWARE REMOVAL;  Surgeon: Myrene Galas, MD;  [facility] OR;  Service: Orthopedics;  Laterality: Left;  . I & D EXTREMITY Left 12/23/2017   Procedure: IRRIGATION AND DEBRIDEMENT LEFT THIGH WOUND AND CLOSURE;  Surgeon: Myrene Galas, MD;  Location: [facility] OR;  Service: Orthopedics;  Laterality: Left;  . KNEE CLOSED REDUCTION Left 01/10/2018   Procedure: CLOSED MANIPULATION KNEE;  Surgeon: Myrene Galas, MD;  Location: [facility] OR;  Service: Orthopedics;  Laterality: Left;  OB History   No obstetric history on file.     Family History  Problem Relation Age of Onset  . Atrial fibrillation Mother        resolved with Cardizem/ history of trigeminey/PVCs.   Marland Kitchen. Hypertension Father     Social History   Tobacco Use  . Smoking status: Former Smoker    Packs/day: 0.50    Years: 6.00    Pack years: 3.00    Types: Cigarettes    Quit date: 12/21/2017    Years since quitting: 2.4  . Smokeless tobacco: Never Used  Vaping Use  . Vaping Use: Former  Substance Use Topics  . Alcohol use: No  . Drug use: Not Currently    Types: Marijuana  employed Lives with  boyfriend  Home Medications Prior to Admission medications   Medication Sig Start Date End Date Taking? Authorizing Provider  amoxicillin (AMOXIL) 500 MG capsule Take 1 capsule (500 mg total) by mouth 3 (three) times daily. 12/30/19   Muthersbaugh, Dahlia ClientHannah, PA-C  cetirizine (ZYRTEC ALLERGY) 10 MG tablet Take 1 tablet (10 mg total) by mouth 2 (two) times daily. 12/30/19   Muthersbaugh, Dahlia ClientHannah, PA-C  escitalopram (LEXAPRO) 10 MG tablet Take 10 mg by mouth daily.    [provider]  fluticasone (FLONASE) 50 MCG/ACT nasal spray Place 2 sprays into both nostrils daily. 12/30/19   Muthersbaugh, Dahlia ClientHannah, PA-C  HYDROcodone-acetaminophen (NORCO) 5-325 MG tablet Take 1-2 tablets by mouth every 8 (eight) hours as needed for moderate pain or severe pain. 11/13/18   Montez MoritaPaul, Keith, PA-C  ketorolac (TORADOL) 10 MG tablet Take 1 tablet (10 mg total) by mouth every 6 (six) hours as needed for moderate pain. 11/13/18   Montez MoritaPaul, Keith, PA-C  levonorgestrel-ethinyl estradiol (AVIANE,ALESSE,LESSINA) 0.1-20 MG-MCG tablet Take 1 tablet by mouth daily. 10/19/18   [provider]  Multiple Vitamin (MULTIVITAMIN WITH MINERALS) TABS tablet Take 1 tablet by mouth daily. 01/04/18   Love, Evlyn KannerPamela S, PA-C  ondansetron (ZOFRAN ODT) 4 MG disintegrating tablet Take 1 tablet (4 mg total) by mouth every 8 (eight) hours as needed for nausea or vomiting. 11/13/18   Montez MoritaPaul, Keith, PA-C    Allergies    Patient has no known allergies.  Review of Systems   Review of Systems  All other systems reviewed and are negative.   Physical Exam Updated Vital Signs BP (!) 147/107 (BP Location: Right Arm)   Pulse 89   Temp 98.7 F (37.1 C) (Oral)   Resp 16   Ht 5\' 2"  (1.575 m)   Wt 59 kg   LMP 06/19/2020   SpO2 97%   BMI 23.78 kg/m   Physical Exam Vitals and nursing note reviewed.  Constitutional:      Comments: Patient is sleeping soundly, I had to shake her leg vigorously to get her to wake up.  HENT:     Head:  Normocephalic and atraumatic.     Right Ear: External ear normal.     Left Ear: External ear normal.  Eyes:     Extraocular Movements: Extraocular movements intact.     Conjunctiva/sclera: Conjunctivae normal.     Pupils: Pupils are equal, round, and reactive to light.  Cardiovascular:     Rate and Rhythm: Normal rate and regular rhythm.     Pulses: Normal pulses.     Heart sounds: No murmur heard.   Pulmonary:     Effort: Pulmonary effort is normal. No respiratory distress.     Breath sounds: Normal breath  sounds.  Musculoskeletal:        General: Normal range of motion.     Cervical back: Normal range of motion.  Skin:    General: Skin is warm and dry.     Capillary Refill: Capillary refill takes less than 2 seconds.     Comments: Patient has multiple superficial linear abrasions on the volar aspect of her right forearm, patient is left-handed. She has multiple tattoos.  Neurological:     General: No focal deficit present.     Mental Status: She is oriented to person, place, and time.     Cranial Nerves: No cranial nerve deficit.  Psychiatric:        Speech: Speech normal.     Comments: Patient is inappropriately smiling and laughing during her exam       ED Results / Procedures / Treatments   Labs (all labs ordered are listed, but only abnormal results are displayed) Results for orders placed or performed during the hospital encounter of 06/19/20  Comprehensive metabolic panel  Result Value Ref Range   Sodium 140 135 - 145 mmol/L   Potassium 4.1 3.5 - 5.1 mmol/L   Chloride 106 98 - 111 mmol/L   CO2 23 22 - 32 mmol/L   Glucose, Bld 93 70 - 99 mg/dL   BUN 9 6 - 20 mg/dL   Creatinine, Ser 3.32 0.44 - 1.00 mg/dL   Calcium 9.2 8.9 - 95.1 mg/dL   Total Protein 8.9 (H) 6.5 - 8.1 g/dL   Albumin 5.0 3.5 - 5.0 g/dL   AST 37 15 - 41 U/L   ALT 23 0 - 44 U/L   Alkaline Phosphatase 76 38 - 126 U/L   Total Bilirubin 0.3 0.3 - 1.2 mg/dL   GFR calc non Af Amer >60 >60 mL/min     GFR calc Af Amer >60 >60 mL/min   Anion gap 11 5 - 15  Ethanol  Result Value Ref Range   Alcohol, Ethyl (B) 227 (H) <10 mg/dL  Salicylate level  Result Value Ref Range   Salicylate Lvl <7.0 (L) 7.0 - 30.0 mg/dL  Acetaminophen level  Result Value Ref Range   Acetaminophen (Tylenol), Serum <10 (L) 10 - 30 ug/mL  cbc  Result Value Ref Range   WBC 5.9 4.0 - 10.5 K/uL   RBC 4.70 3.87 - 5.11 MIL/uL   Hemoglobin 14.9 12.0 - 15.0 g/dL   HCT 88.4 36 - 46 %   MCV 93.4 80.0 - 100.0 fL   MCH 31.7 26.0 - 34.0 pg   MCHC 33.9 30.0 - 36.0 g/dL   RDW 16.6 06.3 - 01.6 %   Platelets 290 150 - 400 K/uL   nRBC 0.0 0.0 - 0.2 %  Rapid urine drug screen (hospital performed)  Result Value Ref Range   Opiates NONE DETECTED NONE DETECTED   Cocaine NONE DETECTED NONE DETECTED   Benzodiazepines NONE DETECTED NONE DETECTED   Amphetamines NONE DETECTED NONE DETECTED   Tetrahydrocannabinol POSITIVE (A) NONE DETECTED   Barbiturates NONE DETECTED NONE DETECTED  Pregnancy, urine  Result Value Ref Range   Preg Test, Ur NEGATIVE NEGATIVE   Laboratory interpretation all normal except alcohol intoxication and positive UDS for marijuana   EKG None  Radiology No results found.  Procedures Procedures (including critical care time)  Medications Ordered in ED Medications - No data to display  ED Course  I have reviewed the triage vital signs and the nursing notes.  Pertinent labs & imaging  results that were available during my care of the patient were reviewed by me and considered in my medical decision making (see chart for details).    MDM Rules/Calculators/A&P                           TTS consult was done as I was leaving patient's room after her interview.  Thurston Pounds, TTS, recommend psychiatrist evaluate in the a.m. patient is still voluntary.  She is very vague about if she is suicidal or if she is cutting to "relieve pressure".  Final Clinical Impression(s) / ED Diagnoses Final diagnoses:   Self-inflicted injury    Rx / DC Orders  Disposition pending  Devoria Albe, MD, Concha Pyo, MD 06/20/20 [id number]

## 2020-06-20 NOTE — ED Notes (Signed)
[facility] called wanting to get permission from pt to speak to her mother, pt refused and [facility] informed of denied consent

## 2020-06-20 NOTE — ED Notes (Signed)
Pt up to the bathroom

## 2020-12-02 ENCOUNTER — Encounter (HOSPITAL_COMMUNITY): Payer: Self-pay | Admitting: *Deleted

## 2020-12-02 ENCOUNTER — Emergency Department (HOSPITAL_COMMUNITY)
Admission: EM | Admit: 2020-12-02 | Discharge: 2020-12-03 | Disposition: A | Discharge status: Home / Self Care | Payer: Medicaid Other | Attending: Emergency Medicine | Admitting: Emergency Medicine

## 2020-12-02 ENCOUNTER — Other Ambulatory Visit: Payer: Self-pay

## 2020-12-02 DIAGNOSIS — F10920 Alcohol use, unspecified with intoxication, uncomplicated: Secondary | ICD-10-CM

## 2020-12-02 DIAGNOSIS — F329 Major depressive disorder, single episode, unspecified: Secondary | ICD-10-CM | POA: Insufficient documentation

## 2020-12-02 DIAGNOSIS — R45851 Suicidal ideations: Secondary | ICD-10-CM | POA: Insufficient documentation

## 2020-12-02 DIAGNOSIS — F17210 Nicotine dependence, cigarettes, uncomplicated: Secondary | ICD-10-CM | POA: Insufficient documentation

## 2020-12-02 DIAGNOSIS — F1094 Alcohol use, unspecified with alcohol-induced mood disorder: Secondary | ICD-10-CM | POA: Diagnosis present

## 2020-12-02 LAB — URINALYSIS, ROUTINE W REFLEX MICROSCOPIC
Bacteria, UA: NONE SEEN
Bilirubin Urine: NEGATIVE
Glucose, UA: NEGATIVE mg/dL
Ketones, ur: NEGATIVE mg/dL
Nitrite: NEGATIVE
Protein, ur: NEGATIVE mg/dL
Specific Gravity, Urine: 1.012 (ref 1.005–1.030)
pH: 5 (ref 5.0–8.0)

## 2020-12-02 LAB — BASIC METABOLIC PANEL
Anion gap: 13 (ref 5–15)
BUN: 9 mg/dL (ref 6–20)
CO2: 22 mmol/L (ref 22–32)
Calcium: 8.9 mg/dL (ref 8.9–10.3)
Chloride: 106 mmol/L (ref 98–111)
Creatinine, Ser: 0.65 mg/dL (ref 0.44–1.00)
GFR, Estimated: >60 mL/min (ref >60)
Glucose, Bld: 89 mg/dL (ref 70–99)
Potassium: 3.9 mmol/L (ref 3.5–5.1)
Sodium: 141 mmol/L (ref 135–145)

## 2020-12-02 LAB — CBC WITH DIFFERENTIAL/PLATELET
Abs Immature Granulocytes: 0.02 10*3/uL (ref 0.00–0.07)
Basophils Absolute: 0 10*3/uL (ref 0.0–0.1)
Basophils Relative: 0 %
Eosinophils Absolute: 0.1 10*3/uL (ref 0.0–0.5)
Eosinophils Relative: 1 %
HCT: 43 % (ref 36.0–46.0)
Hemoglobin: 14.5 g/dL (ref 12.0–15.0)
Immature Granulocytes: 0 %
Lymphocytes Relative: 36 %
Lymphs Abs: 3.3 10*3/uL (ref 0.7–4.0)
MCH: 31.3 pg (ref 26.0–34.0)
MCHC: 33.7 g/dL (ref 30.0–36.0)
MCV: 92.9 fL (ref 80.0–100.0)
Monocytes Absolute: 0.5 10*3/uL (ref 0.1–1.0)
Monocytes Relative: 6 %
Neutro Abs: 5.1 10*3/uL (ref 1.7–7.7)
Neutrophils Relative %: 57 %
Platelets: 308 10*3/uL (ref 150–400)
RBC: 4.63 MIL/uL (ref 3.87–5.11)
RDW: 13 % (ref 11.5–15.5)
WBC: 9.2 10*3/uL (ref 4.0–10.5)
nRBC: 0 % (ref 0.0–0.2)

## 2020-12-02 LAB — PREGNANCY, URINE: Preg Test, Ur: NEGATIVE

## 2020-12-02 LAB — RAPID URINE DRUG SCREEN, HOSP PERFORMED
Amphetamines: NOT DETECTED
Barbiturates: NOT DETECTED
Benzodiazepines: NOT DETECTED
Cocaine: NOT DETECTED
Opiates: NOT DETECTED
Tetrahydrocannabinol: POSITIVE — AB

## 2020-12-02 LAB — ETHANOL: Alcohol, Ethyl (B): 190 mg/dL — ABNORMAL HIGH (ref <10)

## 2020-12-02 NOTE — ED Triage Notes (Signed)
Pt reports she attempted to commit suicide today by cutting her right anterior wrist with a razor blade. Pt reported "this time I really felt like I wanted to do it". Pt reports she was hospitalized 4-5 months ago with suicide attempt. Pt has dried blood to right anterior wrist.

## 2020-12-02 NOTE — ED Provider Notes (Signed)
[facility] EMERGENCY DEPARTMENT Provider Note   CSN: [id number] Arrival date & time: 12/02/20  2051     History Chief Complaint  Patient presents with  . Suicide Attempt    [name] is a 29 y.o. female.  Patient is a 29 year old female with history of PTSD, depression. Patient presents today for evaluation of suicidal ideation. Patient states she cut her wrist earlier this evening in an attempt to harm herself. She has a superficial laceration to the right wrist. She reports feeling suicidal and that she "just wants to die". She does express some recent relationship problems, but declines to elaborate further. She does admit to consuming alcohol this evening, but denies any drug use. Patient takes Lexapro and one other antidepressant she does not recall the name of. She has not been on the second medication due to lack of access to medical care/financial reasons.  The history is provided by the patient.       Past Medical History:  Diagnosis Date  . Anxiety   . Multiple closed fractures of left foot 12/24/2017  . Multiple unstable closed lateral compression fractures of pelvis (HCC), LC 3 pelvic ring fracture  12/24/2017  . Seizures (HCC)    "Epileptic; stress related v/s syncopal epidsode. Two episodes last year-- last one was ~ 10/2017" (12/25/2017)    Patient Active Problem List   Diagnosis Date Noted  . E-coli UTI   . Hypoalbuminemia due to protein-calorie malnutrition (HCC)   . Acute lower UTI   . Transaminitis   . Acute blood loss anemia   . Trauma 12/27/2017  . Status post closed fracture of left femur 12/27/2017  . 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 History of pelvic fracture 12/27/2017  . Multiple unstable closed lateral compression fractures of pelvis (HCC), LC 3 pelvic ring fracture  12/24/2017  . Multiple closed fractures of left foot 12/24/2017  . Laceration with foreign body, left thigh, initial encounter 12/22/2017  . Closed fracture of left distal femur  (HCC) 12/21/2017    Past Surgical History:  Procedure Laterality Date  . DILATION AND CURETTAGE OF UTERUS    . EXTERNAL FIXATION LEG Left 12/22/2017   Procedure: EXTERNAL FIXATION LEG, LEFT KNEE, WOUND DEBRIDEMENT LEFT THIGH WITH VAC APPLICATION;  Surgeon: Yolonda Kida, MD;  Location: [facility] OR;  Service: Orthopedics;  Laterality: Left;  . FRACTURE SURGERY    . HARDWARE REMOVAL Left 11/13/2018   Procedure: HARDWARE REMOVAL;  Surgeon: Myrene Galas, MD;  Location: [facility] OR;  Service: Orthopedics;  Laterality: Left;  . I & D EXTREMITY Left 12/23/2017   Procedure: IRRIGATION AND DEBRIDEMENT LEFT THIGH WOUND AND CLOSURE;  Surgeon: Myrene Galas, MD;  Location: [facility] OR;  Service: Orthopedics;  Laterality: Left;  . KNEE CLOSED REDUCTION Left 01/10/2018   Procedure: CLOSED MANIPULATION KNEE;  Surgeon: Myrene Galas, MD;  Location: [facility] OR;  Service: Orthopedics;  Laterality: Left;     OB History   No obstetric history on file.     Family History  Problem Relation Age of Onset  . Atrial fibrillation Mother        resolved with Cardizem/ history of trigeminey/PVCs.   Marland Kitchen Hypertension Father     Social History   Tobacco Use  . Smoking status: Current Every Day Smoker    Packs/day: 0.25    Years: 6.00    Pack years: 1.50    Types: Cigarettes    Last attempt to quit: 12/21/2017    Years since quitting: 2.9  .  Smokeless tobacco: Never Used  Vaping Use  . Vaping Use: Former  Substance Use Topics  . Alcohol use: Yes    Comment: 1-2 drinks on the weekend   . Drug use: Yes    Types: Marijuana    Home Medications Prior to Admission medications   Medication Sig Start Date End Date Taking? Authorizing Provider  amoxicillin (AMOXIL) 500 MG capsule Take 1 capsule (500 mg total) by mouth 3 (three) times daily. Patient not taking: Reported on 06/20/2020 12/30/19   Muthersbaugh, Dahlia Client, PA-C  cetirizine (ZYRTEC ALLERGY) 10 MG tablet Take 1 tablet (10 mg total) by mouth 2 (two) times  daily. Patient not taking: Reported on 06/20/2020 12/30/19   Muthersbaugh, Dahlia Client, PA-C  escitalopram (LEXAPRO) 20 MG tablet Take 20 mg by mouth daily. 05/16/20   [provider]  fluticasone (FLONASE) 50 MCG/ACT nasal spray Place 2 sprays into both nostrils daily. Patient not taking: Reported on 06/20/2020 12/30/19   Muthersbaugh, Dahlia Client, PA-C  HYDROcodone-acetaminophen (NORCO) 5-325 MG tablet Take 1-2 tablets by mouth every 8 (eight) hours as needed for moderate pain or severe pain. Patient not taking: Reported on 06/20/2020 11/13/18   Montez Morita, PA-C  ketorolac (TORADOL) 10 MG tablet Take 1 tablet (10 mg total) by mouth every 6 (six) hours as needed for moderate pain. Patient not taking: Reported on 06/20/2020 11/13/18   Montez Morita, PA-C  levonorgestrel-ethinyl estradiol (AVIANE,ALESSE,LESSINA) 0.1-20 MG-MCG tablet Take 1 tablet by mouth daily. Patient not taking: Reported on 06/20/2020 10/19/18   [provider]  Multiple Vitamin (MULTIVITAMIN WITH MINERALS) TABS tablet Take 1 tablet by mouth daily. Patient not taking: Reported on 06/20/2020 01/04/18   Love, Evlyn Kanner, PA-C  ondansetron (ZOFRAN ODT) 4 MG disintegrating tablet Take 1 tablet (4 mg total) by mouth every 8 (eight) hours as needed for nausea or vomiting. Patient not taking: Reported on 06/20/2020 11/13/18   Montez Morita, PA-C    Allergies    Patient has no known allergies.  Review of Systems   Review of Systems  All other systems reviewed and are negative.   Physical Exam Updated Vital Signs BP (!) 148/92 (BP Location: Right Arm)   Pulse 81   Temp 97.7 F (36.5 C) (Oral)   Resp (!) 22   Ht 5\' 3"  (1.6 m)   Wt 59 kg   LMP 11/23/2020   SpO2 97%   BMI 23.03 kg/m   Physical Exam Vitals and nursing note reviewed.  Constitutional:      General: She is not in acute distress.    Appearance: She is well-developed and well-nourished. She is not diaphoretic.  HENT:     Head: Normocephalic and atraumatic.   Cardiovascular:     Rate and Rhythm: Normal rate and regular rhythm.     Heart sounds: No murmur heard. No friction rub. No gallop.   Pulmonary:     Effort: Pulmonary effort is normal. No respiratory distress.     Breath sounds: Normal breath sounds. No wheezing.  Abdominal:     General: Bowel sounds are normal. There is no distension.     Palpations: Abdomen is soft.     Tenderness: There is no abdominal tenderness.  Musculoskeletal:        General: Normal range of motion.     Cervical back: Normal range of motion and neck supple.  Skin:    General: Skin is warm and dry.  Neurological:     Mental Status: She is alert and oriented to person, place,  and time.  Psychiatric:        Attention and Perception: Attention normal.        Mood and Affect: Affect is tearful.        Speech: Speech normal.        Behavior: Behavior is agitated.        Thought Content: Thought content includes suicidal ideation. Thought content does not include homicidal ideation. Thought content does not include homicidal plan.        Cognition and Memory: Cognition normal.     ED Results / Procedures / Treatments   Labs (all labs ordered are listed, but only abnormal results are displayed) Labs Reviewed  BASIC METABOLIC PANEL  ETHANOL  CBC WITH DIFFERENTIAL/PLATELET  URINALYSIS, ROUTINE W REFLEX MICROSCOPIC  PREGNANCY, URINE  RAPID URINE DRUG SCREEN, HOSP PERFORMED    EKG None  Radiology No results found.  Procedures Procedures (including critical care time)  Medications Ordered in ED Medications - No data to display  ED Course  I have reviewed the triage vital signs and the nursing notes.  Pertinent labs & imaging results that were available during my care of the patient were reviewed by me and considered in my medical decision making (see chart for details).    MDM Rules/Calculators/A&P  Patient presenting here voluntarily with complaints of suicidal ideation.  Patient has consumed  alcohol today, then scratched her wrist.  There is no need for suturing.  Patient will be evaluated by TTS who will assist in determining the final disposition.  Final Clinical Impression(s) / ED Diagnoses Final diagnoses:  None    Rx / DC Orders ED Discharge Orders    None       Geoffery Lyons, MD 12/06/20 1538

## 2020-12-02 NOTE — ED Notes (Signed)
Patient was given dinner meal and a soda.

## 2020-12-02 NOTE — ED Notes (Addendum)
Pt has dried blood on her right anterior forearm with scratches present, states these marks she made with her fingernails.  Pt also states she has been "picking" at scabs on the top of my head.   Pt requesting something for sleep.  Informed pt that she would be getting a psych consult asap.  Pt agreeable, nad

## 2020-12-02 NOTE — ED Notes (Signed)
Pt's clothing and jewelry removed, placed in pt belongings bag and placed in lockers by Charlynne Pander, Mental Health Tech. Pt is dressed in burgundy scrubs. Security called to wand pt.

## 2020-12-03 DIAGNOSIS — F1094 Alcohol use, unspecified with alcohol-induced mood disorder: Secondary | ICD-10-CM | POA: Diagnosis present

## 2020-12-03 MED ORDER — NICOTINE 14 MG/24HR TD PT24
14.0000 mg | MEDICATED_PATCH | Freq: Once | TRANSDERMAL | Status: DC
  Administered 2020-12-03: 12:00:00 14 mg via TRANSDERMAL
  Filled 2020-12-03: qty 1

## 2020-12-03 NOTE — BH Assessment (Signed)
Pt consented for clinician to contact her husband Kathryne Eriksson, [phone]) to gather additional information however clinician received the message: "I'm sorry the voicemail box has not been set up goodbye." Clinician was unable to leave a HIPPA compliant voice message.     Redmond Pulling, MS, [facility], [facility] Triage Specialist [phone]
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
## 2020-12-03 NOTE — ED Notes (Signed)
Pt keys given to husband, ok per patient.

## 2020-12-03 NOTE — ED Notes (Signed)
Pt husband left before getting keys, key locked back with patient belongings.

## 2020-12-03 NOTE — ED Provider Notes (Signed)
Psychiatry has seen and cleared patient.  Patient tells me that she drank too much alcohol which caused all of her issues last night.  She is not suicidal.  She is not psychotic.  Stable for discharge.   Pricilla Loveless, MD 12/03/20 1250
